# Patient Record
Sex: Male | Born: 1965 | Race: Black or African American | Hispanic: No | Marital: Married | Smoking: Current every day smoker
Health system: Southern US, Community
[De-identification: ages and names within clinical notes are randomized; demographics above are authoritative.]

## PROBLEM LIST (undated history)

## (undated) DIAGNOSIS — I10 Essential (primary) hypertension: Secondary | ICD-10-CM

## (undated) DIAGNOSIS — E119 Type 2 diabetes mellitus without complications: Secondary | ICD-10-CM

## (undated) HISTORY — DX: Type 2 diabetes mellitus without complications: E11.9

## (undated) HISTORY — PX: OTHER SURGICAL HISTORY: SHX169

## (undated) HISTORY — PX: HERNIA REPAIR: SHX51

---

## 1998-09-01 ENCOUNTER — Emergency Department (HOSPITAL_COMMUNITY): Admission: EM | Admit: 1998-09-01 | Discharge: 1998-09-01 | Payer: Self-pay | Admitting: Emergency Medicine

## 1999-12-10 ENCOUNTER — Ambulatory Visit (HOSPITAL_BASED_OUTPATIENT_CLINIC_OR_DEPARTMENT_OTHER): Admission: RE | Admit: 1999-12-10 | Discharge: 1999-12-10 | Payer: Self-pay | Admitting: Orthopedic Surgery

## 2000-08-31 ENCOUNTER — Emergency Department (HOSPITAL_COMMUNITY): Admission: EM | Admit: 2000-08-31 | Discharge: 2000-08-31 | Payer: Self-pay | Admitting: Emergency Medicine

## 2001-08-30 ENCOUNTER — Emergency Department (HOSPITAL_COMMUNITY): Admission: EM | Admit: 2001-08-30 | Discharge: 2001-08-30 | Payer: Self-pay | Admitting: Emergency Medicine

## 2001-08-30 ENCOUNTER — Encounter: Payer: Self-pay | Admitting: Emergency Medicine

## 2005-02-05 ENCOUNTER — Emergency Department (HOSPITAL_COMMUNITY): Admission: EM | Admit: 2005-02-05 | Discharge: 2005-02-06 | Payer: Self-pay | Admitting: Emergency Medicine

## 2005-02-07 ENCOUNTER — Encounter (INDEPENDENT_AMBULATORY_CARE_PROVIDER_SITE_OTHER): Payer: Self-pay | Admitting: General Surgery

## 2005-02-07 ENCOUNTER — Inpatient Hospital Stay (HOSPITAL_COMMUNITY): Admission: EM | Admit: 2005-02-07 | Discharge: 2005-02-09 | Payer: Self-pay | Admitting: Emergency Medicine

## 2006-03-10 ENCOUNTER — Ambulatory Visit: Payer: Self-pay | Admitting: Family Medicine

## 2008-03-27 ENCOUNTER — Emergency Department (HOSPITAL_COMMUNITY): Admission: EM | Admit: 2008-03-27 | Discharge: 2008-03-27 | Payer: Self-pay | Admitting: Emergency Medicine

## 2010-06-20 NOTE — Op Note (Signed)
NAME:  JMARI, PELC                ACCOUNT NO.:  [id number]   MEDICAL RECORD NO.:  [id number]          PATIENT TYPE:  INP   LOCATION:  A331                          FACILITY:  [facility]   PHYSICIAN:  Jerolyn Shin C. Katrinka Blazing, M.D.   DATE OF BIRTH:  12-08-1965   DATE OF PROCEDURE:  02/07/2005  DATE OF DISCHARGE:  02/09/2005                                 OPERATIVE REPORT   PREOPERATIVE DIAGNOSIS:  Left inguinal hernia with incarceration.   POSTOPERATIVE DIAGNOSIS:  Left inguinal hernia with inflammation of the cord  and left testicle.   PROCEDURE:  Left inguinal hernia repair with exploration of left  hemiscrotum.   SURGEON:  Dr. Katrinka Blazing.   DESCRIPTION:  Under general anesthesia, the patient's left inguinal area and  genitalia were prepped and draped in a sterile field. Curvilinear incision  was made in the left inguinal area. Incision extended down to the  aponeurosis. The aponeurosis was opened in the line of its fibers. Upon  opening the aponeurosis, there was evidence of inflammation of the cord.  This inflammation extended back to the internal ring. Exploration revealed a  sac which was separated from the cord, high ligated, excised. There was also  lipoma of the cord that was dissected. This was high ligated and excised.  The inguinal floor was attenuated, but it was felt that it would not be best  to do a formal repair at this time since there did not appear to be a major  direct component. The cord was explored down to the testicle, and the  testicle also appeared to be acutely inflamed, suggesting that he had an  orchitis. The epididymis also appeared to be inflamed, and this appeared to  be secondarily inflamed rather than a primary source. Once this was done, it  was decided that we would simply treat the patient with antibiotics and anti-  inflammatories and watch him. His testicle was placed back in the scrotal  sac, and the cord was placed in anatomic position. Aponeurosis was  closed  with 3-0 Monocryl. Subcutaneous tissue was closed with 3-0 Monocryl. Skin  was closed with 4-0 Vicryl. Dressing was placed. The patient was giving a  scrotal support. He was awakened from anesthesia uneventfully and traversed  to a bed and taken to the post-anesthesia care unit in satisfactory  condition.      Dirk Dress. Katrinka Blazing, M.D.  Electronically Signed     LCS/MEDQ  D:  03/24/2005  T:  03/25/2005  Job:  [id number]

## 2010-06-20 NOTE — Discharge Summary (Signed)
NAME:  Matthew Thompson, Matthew Thompson                ACCOUNT NO.:  [id number]   MEDICAL RECORD NO.:  [id number]          PATIENT TYPE:  INP   LOCATION:  A331                          FACILITY:  [facility]   PHYSICIAN:  Jerolyn Shin C. Katrinka Blazing, M.D.   DATE OF BIRTH:  [birth year]   DATE OF ADMISSION:  02/07/2005  DATE OF DISCHARGE:  01/08/2007LH                                 DISCHARGE SUMMARY   DISCHARGE DIAGNOSIS:  Left inguinal hernia with inflammation of the cord and  left testicle.   SPECIAL PROCEDURE:  Left inguinal hernia repair with exploration of the left  hemiscrotum.   DISPOSITION:  The patient discharged home in stable satisfactory condition.   DISCHARGE MEDICATIONS:  1.  Ibuprofen 800 mg three times daily.  2.  Levaquin 750 mg daily.  3.  Doxycycline 100 mg twice daily.  4.  Oxycodone 5/325 one every 4 hours as needed.   FOLLOW UP:  The patient is scheduled to be seen in the office 2 weeks post  discharge.   SUMMARY:  This is a 45 year old male with a history of acute onset of  swelling of the left testicle and inguinal area on January 4. He was seen in  the emergency room and was advised to be admitted for urgent surgery but he  refused. He returned on January 5 because of constant pain. He was scheduled  emergently. Past history is negative otherwise. On examination he had a  large tender mass of the left scrotum and cord. It was suspected that he had  a hydrocele and hernia. He was explored and was found to have a hernia with  inflammation of the scrotum and cord. High ligation of the sac with excision  of the lipoma was carried out. No repair of the floor was done because of  the inflammation and the lack of knowledge of the source of the  inflammation. The patient was treated with antibiotics in the postoperative  period and did very well, was monitored for 2 days and was discharged home  on antibiotics in satisfactory condition.      Dirk Dress. Katrinka Blazing, M.D.  Electronically Signed     LCS/MEDQ  D:  03/24/2005  T:  03/25/2005  Job:  [id number]

## 2010-06-20 NOTE — Op Note (Signed)
[facility]  Patient:    Matthew Thompson, Matthew Thompson                         MRN: [id number] Proc. Date: 12/10/99 Adm. Date:  [id number] Attending:  Marlowe Shores CC:         Artist Pais Mina Marble, M.D. (2)   Operative Report  PREOPERATIVE DIAGNOSIS:  Chronic instability, left thumb metacarpal phalangeal joint.  POSTOPERATIVE DIAGNOSIS:  Chronic instability, left thumb metacarpal phalangeal joint.  PROCEDURE:  Left thumb metacarpal phalangeal joint arthrodesis using tension band technique, using two 035 K-wires and #24 gauge stainless steel wire.  SURGEON:  Artist Pais. Mina Marble, M.D.  ASSISTANT:  Junius Roads. Ireton, P.A.-C.  ANESTHESIA:  General.  TOURNIQUET TIME:  45 minutes.  COMPLICATIONS:  None.  DRAINS:  None.  DESCRIPTION OF PROCEDURE:  The patient was taken to the operating room, and after the induction of adequate general anesthesia, the left upper extremity was prepped and draped in the usual sterile fashion.  An Esmarch was used to exsanguinate the limb.  The tourniquet was inflated to 250 mmHg.  At this point in time, a longitudinal incision was made over the metacarpal phalangeal joint of the left thumb.  The incision was taken down through the skin and subcutaneous tissues, until the interval between the EPL and EPB tendons was identified.  This was split longitudinally, thus exposing the capsule of the metacarpal phalangeal joint.  The capsule of the metacarpal phalangeal joint was split longitudinally, thus exposing the metacarpal phalangeal joint.  At this point in time, the cartilage of the metacarpal head and the base of the proximal phalanx was denuded using a combination of a sharp rongeur and an osteotome until cancellus bone was encountered.  The cancellus bone was then drilled with a 035 K-wire to produce bleeding.  At this point in time, two 035 K-wires were driven from distal to proximal and from volar to dorsal, out  the metacarpal head and neck area.  The joint was then reduced and placed in 15 degrees of flexion and 10 degrees of pronation.  The K-wires were driven across the fusion site.  Next the 035 K-wire was used to make a transosseous tunnel 1.0 cm distal to the fusion site, and a #24 gauge wire was passed through this hole.  This was then fashioned in a figure-of-eight tension band across the fusion site, and tied down accordingly, over the prevent 035 K-wires that had been placed previously.  Intraoperative x-rays showed a good reduction of the joint, with 15-20 degrees of flexion, 10 degrees of pronation, and good placement of all hardware.  This was then tensioned down appropriately and cut.  The K-wires were buried into the bone, and the tension band was bent down onto the bone as well.  The wound was then thoroughly irrigated.  The capsule was closed with #4-0 Mersilene, and the skin was then closed with a running #3-0 Prolene subcuticular stitch.  Steri-Strips, 4 x 4s, and fluffs, and a radial gutter splint were applied.  The patient tolerated the procedure well and went to the recovery room in a stable fashion.DD:  12/10/99 TD:  12/10/99 Job: [id number]

## 2013-11-19 ENCOUNTER — Encounter (HOSPITAL_COMMUNITY): Payer: Self-pay | Admitting: Emergency Medicine

## 2013-11-19 ENCOUNTER — Emergency Department (HOSPITAL_COMMUNITY)
Admission: EM | Admit: 2013-11-19 | Discharge: 2013-11-19 | Disposition: A | Discharge status: Home / Self Care | Payer: Self-pay | Attending: Emergency Medicine | Admitting: Emergency Medicine

## 2013-11-19 ENCOUNTER — Emergency Department (HOSPITAL_COMMUNITY): Payer: Self-pay

## 2013-11-19 DIAGNOSIS — M25511 Pain in right shoulder: Secondary | ICD-10-CM | POA: Insufficient documentation

## 2013-11-19 DIAGNOSIS — Z7952 Long term (current) use of systemic steroids: Secondary | ICD-10-CM | POA: Insufficient documentation

## 2013-11-19 DIAGNOSIS — Z720 Tobacco use: Secondary | ICD-10-CM | POA: Insufficient documentation

## 2013-11-19 MED ORDER — OXYCODONE-ACETAMINOPHEN 5-325 MG PO TABS
2.0000 | ORAL_TABLET | Freq: Once | ORAL | Status: AC
  Administered 2013-11-19: 2 via ORAL
  Filled 2013-11-19: qty 2

## 2013-11-19 MED ORDER — PREDNISONE 50 MG PO TABS: ORAL_TABLET | ORAL | Status: DC

## 2013-11-19 MED ORDER — CYCLOBENZAPRINE HCL 10 MG PO TABS: 10.0000 mg | ORAL_TABLET | Freq: Two times a day (BID) | ORAL | Status: DC | PRN

## 2013-11-19 MED ORDER — PREDNISONE 50 MG PO TABS
60.0000 mg | ORAL_TABLET | Freq: Once | ORAL | Status: AC
  Administered 2013-11-19: 60 mg via ORAL
  Filled 2013-11-19 (×2): qty 1

## 2013-11-19 MED ORDER — OXYCODONE-ACETAMINOPHEN 5-325 MG PO TABS: 1.0000 | ORAL_TABLET | ORAL | Status: DC | PRN

## 2013-11-19 NOTE — ED Notes (Signed)
PT reports right shoulder/arm pain x2 weeks. PT denies any injury but states he does some heavy lifting on his job.

## 2013-11-19 NOTE — Discharge Instructions (Signed)
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   Followup with orthopedic doctor. Phone number given.  Prescription for pain medication, muscle relaxer, prednisone

## 2013-11-19 NOTE — ED Notes (Signed)
Patient with no complaints at this time. Respirations even and unlabored. Skin warm/dry. Discharge instructions reviewed with patient at this time. Patient given opportunity to voice concerns/ask questions. Patient discharged at this time and left Emergency Department with steady gait.   

## 2013-11-19 NOTE — ED Provider Notes (Signed)
CSN: [id number]     Arrival date & time 11/19/13  1705 History   First MD Initiated Contact with Patient 11/19/13 1801     Chief Complaint  Patient presents with  . Shoulder Pain     (Consider location/radiation/quality/duration/timing/severity/associated sxs/prior Treatment) HPI.... right shoulder pain for 2 weeks. 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 No other known injury. No substernal chest pain, dyspnea, neurological deficits, fever, chills.  Severity is moderate. He has tried over-the-counter products with minimal relief.  History reviewed. No pertinent past medical history. Past Surgical History  Procedure Laterality Date  . Left hand     No family history on file. History  Substance Use Topics  . Smoking status: Current Every Day Smoker -- 0.50 packs/day    Types: Cigarettes  . Smokeless tobacco: Not on file  . Alcohol Use: Yes     Comment: occassionally    Review of Systems  All other systems reviewed and are negative.     Allergies  Review of patient's allergies indicates no known allergies.  Home Medications   Prior to Admission medications   Medication Sig Start Date End Date Taking? Authorizing Provider  ibuprofen (ADVIL,MOTRIN) 200 MG tablet Take 800-1,000 mg by mouth every 6 (six) hours as needed.   Yes Historical Provider, MD  cyclobenzaprine (FLEXERIL) 10 MG tablet Take 1 tablet (10 mg total) by mouth 2 (two) times daily as needed for muscle spasms. 11/19/13   Donnetta HutchingBrian Zana Biancardi, MD  oxyCODONE-acetaminophen (PERCOCET) 5-325 MG per tablet Take 1-2 tablets by mouth every 4 (four) hours as needed. 11/19/13   Donnetta HutchingBrian Katrenia Alkins, MD  predniSONE (DELTASONE) 50 MG tablet 1 tablet daily for 6 days, then one half tablet daily for 6 days 11/19/13   Donnetta HutchingBrian Tameca Jerez, MD   BP 148/91  Pulse 82  Temp(Src) 98.6 F (37 C) (Oral)  Resp 20  Ht 6\' 4"  (1.93 m)  Wt 247 lb (112.038 kg)  BMI 30.08 kg/m2  SpO2 100% Physical  Exam  Nursing note and vitals reviewed. Constitutional: He is oriented to person, place, and time. He appears well-developed and well-nourished.  HENT:  Head: Normocephalic and atraumatic.  Eyes: Conjunctivae and EOM are normal. Pupils are equal, round, and reactive to light.  Neck: Normal range of motion. Neck supple.  Cervical spine is nontender  Cardiovascular: Normal rate, regular rhythm and normal heart sounds.   Pulmonary/Chest: Effort normal and breath sounds normal.  Abdominal: Soft. Bowel sounds are normal.  Musculoskeletal:  Right upper extremity: Generalized tenderness around the rotator cuff.  Pain noted with range of motion around the shoulder joint.  Neurological: He is alert and oriented to person, place, and time.  Skin: Skin is warm and dry.  Psychiatric: He has a normal mood and affect. His behavior is normal.    ED Course  Procedures (including critical care time) Labs Review Labs Reviewed - No data to display  Imaging Review Dg Shoulder Right  11/19/2013   CLINICAL DATA:  48 year old male with right shoulder pain for 2 weeks. No known injury.  EXAM: RIGHT SHOULDER - 2+ VIEW  COMPARISON:  None.  FINDINGS: No acute bony abnormality. No significant soft tissue swelling. Glenohumeral joint is congruent. Degenerative changes of the acromioclavicular joint. Unremarkable appearance of the proximal humerus.  Unremarkable appearance of the visualized right thorax.  IMPRESSION: No acute abnormality.  Degenerative changes of the acromioclavicular joint.  Signed,  Yvone NeuJaime S. Loreta AveWagner, DO  [facility]  [facility]   Electronically Signed   By: Gilmer MorJaime  Wagner D.O.   On: 11/19/2013 19:06     EKG Interpretation None      MDM   Final diagnoses:  Right shoulder pain    I suspect a repetitive motion injury. Plain films of the right shoulder show no acute abnormalities;  However, there is degenerative changes in the acromioclavicular  joint. Discharge medications Percocet, prednisone 50 mg, Flexeril 10 mg.  Referral to orthopedics    Donnetta HutchingBrian Mercedez Boule, MD 11/19/13 2048

## 2013-11-29 ENCOUNTER — Encounter (HOSPITAL_COMMUNITY): Payer: Self-pay | Admitting: Emergency Medicine

## 2013-11-29 ENCOUNTER — Emergency Department (HOSPITAL_COMMUNITY): Payer: Self-pay

## 2013-11-29 ENCOUNTER — Emergency Department (HOSPITAL_COMMUNITY)
Admission: EM | Admit: 2013-11-29 | Discharge: 2013-11-29 | Disposition: A | Discharge status: Home / Self Care | Payer: Self-pay | Attending: Emergency Medicine | Admitting: Emergency Medicine

## 2013-11-29 DIAGNOSIS — Y9389 Activity, other specified: Secondary | ICD-10-CM | POA: Insufficient documentation

## 2013-11-29 DIAGNOSIS — X58XXXA Exposure to other specified factors, initial encounter: Secondary | ICD-10-CM | POA: Insufficient documentation

## 2013-11-29 DIAGNOSIS — Y9289 Other specified places as the place of occurrence of the external cause: Secondary | ICD-10-CM | POA: Insufficient documentation

## 2013-11-29 DIAGNOSIS — S46001A Unspecified injury of muscle(s) and tendon(s) of the rotator cuff of right shoulder, initial encounter: Secondary | ICD-10-CM | POA: Insufficient documentation

## 2013-11-29 DIAGNOSIS — S46111A Strain of muscle, fascia and tendon of long head of biceps, right arm, initial encounter: Secondary | ICD-10-CM | POA: Insufficient documentation

## 2013-11-29 DIAGNOSIS — M25511 Pain in right shoulder: Secondary | ICD-10-CM

## 2013-11-29 DIAGNOSIS — Z720 Tobacco use: Secondary | ICD-10-CM | POA: Insufficient documentation

## 2013-11-29 DIAGNOSIS — R52 Pain, unspecified: Secondary | ICD-10-CM

## 2013-11-29 MED ORDER — CYCLOBENZAPRINE HCL 10 MG PO TABS: 10.0000 mg | ORAL_TABLET | Freq: Two times a day (BID) | ORAL | Status: DC | PRN

## 2013-11-29 MED ORDER — NAPROXEN 500 MG PO TABS: 500.0000 mg | ORAL_TABLET | Freq: Two times a day (BID) | ORAL | Status: DC

## 2013-11-29 MED ORDER — OXYCODONE-ACETAMINOPHEN 5-325 MG PO TABS: 1.0000 | ORAL_TABLET | ORAL | Status: DC | PRN

## 2013-11-29 NOTE — ED Provider Notes (Signed)
CSN: [id number]     Arrival date & time 11/29/13  0827 History   First MD Initiated Contact with Patient 11/29/13 [phone]     Chief Complaint  Patient presents with  . Shoulder Pain     (Consider location/radiation/quality/duration/timing/severity/associated sxs/prior Treatment) Patient is a 48 y.o. male presenting with shoulder pain. The history is provided by the patient.  Shoulder Pain The current episode started more than 1 month ago. The problem occurs constantly. The problem has been gradually worsening. He has tried NSAIDs and oral narcotics (muscle relaxant) for the symptoms. The treatment provided mild relief.   Matthew Thompson is a 48 y.o. male who presents to the ED with right shoulder pain. The pain started 3 months ago and has gotten progressively worse. He was evaluated here 2 or more weeks ago and had x-rays and treated with muscle relaxant, narcotics and prednisone. The patient states the pain is worse than on the previous visit. 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 He may lift 200 pounds when he works out.    History reviewed. No pertinent past medical history. Past Surgical History  Procedure Laterality Date  . Left hand     No family history on file. History  Substance Use Topics  . Smoking status: Current Every Day Smoker -- 0.50 packs/day    Types: Cigarettes  . Smokeless tobacco: Not on file  . Alcohol Use: Yes     Comment: occassionally    Review of Systems Negative except as stated in HPI   Allergies  Review of patient's allergies indicates no known allergies.  Home Medications   Prior to Admission medications   Medication Sig Start Date End Date Taking? Authorizing Provider  ibuprofen (ADVIL,MOTRIN) 200 MG tablet Take 800-1,000 mg by mouth every 6 (six) hours as needed.   Yes Historical Provider, MD   BP 164/101  Pulse 81  Temp(Src) 97.6 F (36.4 C) (Oral)  Resp 19  Ht 6\' 1"  (1.854 m)  Wt 247  lb (112.038 kg)  BMI 32.59 kg/m2  SpO2 99% Physical Exam  Nursing note and vitals reviewed. Constitutional: He is oriented to person, place, and time. He appears well-developed and well-nourished.  HENT:  Head: Normocephalic and atraumatic.  Eyes: Conjunctivae and EOM are normal.  Neck: Normal range of motion. Neck supple.  Cardiovascular: Normal rate and regular rhythm.   Pulmonary/Chest: Effort normal and breath sounds normal.  Musculoskeletal:       Right shoulder: He exhibits decreased range of motion, tenderness, pain and spasm. He exhibits no crepitus, no deformity, no laceration, normal pulse and normal strength.       Arms: Radial pulses equal. Adequate circulation, good touch sensation. Pain with any range of motion of the shoulder. Increased pain with raising the arm.   Neurological: He is alert and oriented to person, place, and time. No cranial nerve deficit.  Skin: Skin is warm and dry.  Psychiatric: He has a normal mood and affect. His behavior is normal.    ED Course  Procedures (including critical care time) Labs Review Mr Shoulder Right Wo Contrast  11/29/2013   CLINICAL DATA:  Right shoulder pain for over 3 months. No known injury or prior relevant surgery. Initial encounter.  EXAM: MRI OF THE RIGHT SHOULDER WITHOUT CONTRAST  TECHNIQUE: Multiplanar, multisequence MR imaging of the shoulder was performed. No intravenous contrast was administered.  COMPARISON:  Radiographs 11/19/2013.  FINDINGS: Rotator cuff:  There is distal supraspinatus tendinosis with cystic change in the adjacent greater tuberosity. There is mild associated bursal surface fraying, but no full-thickness rotator cuff tear. The infraspinous, subscapularis and teres minor tendons appear normal.  Muscles: The rotator cuff muscles are well developed. There is no focal muscular atrophy or edema.  Biceps long head: The intra-articular portion of the biceps tendon appears normal. Distally, the biceps tendon  appears split with associated loculated fluid in its sheath.  Acromioclavicular Joint: The acromion is type 2. There are moderate acromioclavicular degenerative changes. There is subchondral cyst formation and edema within the distal clavicle. No osteolysis is apparent. A small amount of fluid is present in the subacromial and subdeltoid bursa.  Glenohumeral Joint: No significant shoulder joint effusion or glenohumeral arthropathy.  Labrum:  No evidence of labral tear.  Bones:  No significant extra-articular osseous findings.  IMPRESSION: 1. Distal supraspinatus tendinosis and partial bursal surface fraying. No evidence of full-thickness rotator cuff tear or tendon retraction. 2. Moderate acromioclavicular degenerative changes with mild subacromial -subdeltoid bursitis. 3. Suspected split tear of the biceps tendon long head with associated tenosynovitis. A normal variant accounting for this appearance is felt to be less likely.   Electronically Signed   By: Roxy HorsemanBill  Veazey M.D.   On: 11/29/2013 11:33    MDM  48 y.o. male with pain in the right shoulder x 3 months that has gotten worse. I have reviewed this patient's vital signs, nurses notes, appropriate labs and imaging.  I discussed findings of the MR with the patient and need for follow up with the orthopedic doctor as soon as possible. Patient voices understanding and agrees with plan. Also discussed elevated BP and need for follow up. Stable for discharge without neurovascular deficits. Will treat for pain, placed in shoulder immobilizer, ice and rest. Follow up with ortho.    Medication List    TAKE these medications       cyclobenzaprine 10 MG tablet  Commonly known as:  FLEXERIL  Take 1 tablet (10 mg total) by mouth 2 (two) times daily as needed for muscle spasms.     naproxen 500 MG tablet  Commonly known as:  NAPROSYN  Take 1 tablet (500 mg total) by mouth 2 (two) times daily.     oxyCODONE-acetaminophen 5-325 MG per tablet  Commonly known  as:  ROXICET  Take 1 tablet by mouth every 4 (four) hours as needed for severe pain.      ASK your doctor about these medications       ibuprofen 200 MG tablet  Commonly known as:  ADVIL,MOTRIN  Take 800-1,000 mg by mouth every 6 (six) hours as needed.            [facility] [facility] Orlene OchM Erland Vivas, NP 12/02/13 1054

## 2013-11-29 NOTE — ED Notes (Addendum)
Pt states shoulder pain, stating he was here 3 weeks ago for same. Denies injury. States medications prescribed last visit did not provide relief. Pt states he was told to come back here if pain became worse and states he was not referred to orthopedist.

## 2013-11-29 NOTE — Discharge Instructions (Signed)
Your MRI today shows that you have injured your right shoulder and will need to follow up with an orthopedic surgeon. Do not take the narcotic pain medication if you are driving as it will make you sleepy. Do not take the muscle relaxant if you are driving as it will also make you sleepy.  Mr Shoulder Right Wo Contrast  11/29/2013   CLINICAL DATA:  Right shoulder pain for over 3 months. No known injury or prior relevant surgery. Initial encounter.  EXAM: MRI OF THE RIGHT SHOULDER WITHOUT CONTRAST  TECHNIQUE: Multiplanar, multisequence MR imaging of the shoulder was performed. No intravenous contrast was administered.  COMPARISON:  Radiographs 11/19/2013.  FINDINGS: Rotator cuff: There is distal supraspinatus tendinosis with cystic change in the adjacent greater tuberosity. There is mild associated bursal surface fraying, but no full-thickness rotator cuff tear. The infraspinous, subscapularis and teres minor tendons appear normal.  Muscles: The rotator cuff muscles are well developed. There is no focal muscular atrophy or edema.  Biceps long head: The intra-articular portion of the biceps tendon appears normal. Distally, the biceps tendon appears split with associated loculated fluid in its sheath.  Acromioclavicular Joint: The acromion is type 2. There are moderate acromioclavicular degenerative changes. There is subchondral cyst formation and edema within the distal clavicle. No osteolysis is apparent. A small amount of fluid is present in the subacromial and subdeltoid bursa.  Glenohumeral Joint: No significant shoulder joint effusion or glenohumeral arthropathy.  Labrum:  No evidence of labral tear.  Bones:  No significant extra-articular osseous findings.  IMPRESSION: 1. Distal supraspinatus tendinosis and partial bursal surface fraying. No evidence of full-thickness rotator cuff tear or tendon retraction. 2. Moderate acromioclavicular degenerative changes with mild subacromial -subdeltoid bursitis. 3.  Suspected split tear of the biceps tendon long head with associated tenosynovitis. A normal variant accounting for this appearance is felt to be less likely.   Electronically Signed   By: Roxy HorsemanBill  Veazey M.D.   On: 11/29/2013 11:33

## 2013-12-12 ENCOUNTER — Ambulatory Visit: Payer: Self-pay | Admitting: Orthopedic Surgery

## 2014-12-15 ENCOUNTER — Encounter (HOSPITAL_COMMUNITY): Payer: Self-pay | Admitting: *Deleted

## 2014-12-15 ENCOUNTER — Emergency Department (HOSPITAL_COMMUNITY)
Admission: EM | Admit: 2014-12-15 | Discharge: 2014-12-16 | Disposition: A | Discharge status: Home / Self Care | Payer: Self-pay | Attending: Emergency Medicine | Admitting: Emergency Medicine

## 2014-12-15 ENCOUNTER — Emergency Department (HOSPITAL_COMMUNITY): Payer: Self-pay

## 2014-12-15 DIAGNOSIS — Y998 Other external cause status: Secondary | ICD-10-CM | POA: Insufficient documentation

## 2014-12-15 DIAGNOSIS — Y9289 Other specified places as the place of occurrence of the external cause: Secondary | ICD-10-CM | POA: Insufficient documentation

## 2014-12-15 DIAGNOSIS — S51012A Laceration without foreign body of left elbow, initial encounter: Secondary | ICD-10-CM | POA: Insufficient documentation

## 2014-12-15 DIAGNOSIS — F17210 Nicotine dependence, cigarettes, uncomplicated: Secondary | ICD-10-CM | POA: Insufficient documentation

## 2014-12-15 DIAGNOSIS — Y9389 Activity, other specified: Secondary | ICD-10-CM | POA: Insufficient documentation

## 2014-12-15 DIAGNOSIS — W25XXXA Contact with sharp glass, initial encounter: Secondary | ICD-10-CM | POA: Insufficient documentation

## 2014-12-15 DIAGNOSIS — I10 Essential (primary) hypertension: Secondary | ICD-10-CM | POA: Insufficient documentation

## 2014-12-15 HISTORY — DX: Essential (primary) hypertension: I10

## 2014-12-15 MED ORDER — LIDOCAINE-EPINEPHRINE (PF) 2 %-1:200000 IJ SOLN
20.0000 mL | Freq: Once | INTRAMUSCULAR | Status: AC
  Administered 2014-12-15: 20 mL
  Filled 2014-12-15: qty 20

## 2014-12-15 MED ORDER — HYDROCODONE-ACETAMINOPHEN 5-325 MG PO TABS
2.0000 | ORAL_TABLET | Freq: Once | ORAL | Status: AC
  Administered 2014-12-15: 2 via ORAL
  Filled 2014-12-15: qty 2

## 2014-12-15 NOTE — ED Notes (Addendum)
Pt leaned on a glass counter and his left elbow went thru it. Pt states his elbow has been shooting blood since 4pm. Elbow wrapped tight in triage by family. Small amount of blood oozing through dressing.

## 2014-12-15 NOTE — ED Provider Notes (Signed)
CSN: [id number]     Arrival date & time 12/15/14  2024 History  By signing my name below, I, Doreatha Martinva Mathews, attest that this documentation has been prepared under the direction and in the presence of Lavera Guiseana Duo Malie Kashani, MD. Electronically Signed: Doreatha MartinEva Mathews, ED Scribe. 12/15/2014. 9:53 PM.   Chief Complaint  Patient presents with  . Extremity Laceration   The history is provided by the patient. No language interpreter was used.   HPI Comments: Matthew Thompson is a 49 y.o. male with h/o HTN who is right hand dominant who presents to the Emergency Department complaining of a laceration with controlled bleeding to the lateral aspect of the left elbow that occurred this afternoon at 4PM. Wife states that he leaned on a glass counter and his elbow broke and went through the glass. Bleeding controlled with pressure dressing applied PTA. He states associated moderate shooting pain in the forearm. Tdap UTD. No h/o prior fractures. Pt denies anticoagulant use. He also denies numbness.     Past Medical History  Diagnosis Date  . Hypertension    Past Surgical History  Procedure Laterality Date  . Left hand     History reviewed. No pertinent family history. Social History  Substance Use Topics  . Smoking status: Current Every Day Smoker -- 0.50 packs/day    Types: Cigarettes  . Smokeless tobacco: None  . Alcohol Use: Yes     Comment: occassionally    Review of Systems 10/14 systems reviewed and are negative other than those stated in the HPI.    Allergies  Review of patient's allergies indicates no known allergies.  Home Medications   Prior to Admission medications   Not on File   BP 124/75 mmHg  Pulse 99  Temp(Src) 98 F (36.7 C) (Oral)  Resp 13  Ht 6\' 4"  (1.93 m)  Wt 252 lb (114.306 kg)  BMI 30.69 kg/m2  SpO2 93% Physical Exam Physical Exam  Nursing note and vitals reviewed. Constitutional: Well developed, well nourished, non-toxic, and in no acute distress Head: Normocephalic  and atraumatic.  Mouth/Throat: Oropharynx is clear and moist.  Neck: Normal range of motion. Neck supple.  Cardiovascular: Normal rate and regular rhythm.   Pulmonary/Chest: Effort normal and breath sounds normal.  Abdominal: Soft. There is no tenderness. There is no rebound and no guarding.  Musculoskeletal: Normal range of motion.  Neurological: Alert, no facial droop, fluent speech, moves all extremities symmetrically Skin: Skin is warm and dry. There is a 4 cm laceration just inferior to the left elbow. He has intact innervation of the radial, ulnar and median nerves. DTRs intact. 2+ radial pulse on the left. Normal ROM of the elbow, wrist and fingers distally. Initial bleeding from superficial artery, bleeding ceased with pressure. Psychiatric: Cooperative   ED Course  Procedures (including critical care time) DIAGNOSTIC STUDIES: Oxygen Saturation is 100% on RA, normal by my interpretation.    COORDINATION OF CARE: 9:42 PM Discussed treatment plan with pt at bedside and pt agreed to plan.   Imaging Review Dg Elbow Complete Left  12/15/2014  CLINICAL DATA:  Laceration left elbow ulna glass table today. Initial encounter. EXAM: LEFT ELBOW - COMPLETE 3+ VIEW COMPARISON:  None. FINDINGS: No acute bony or joint abnormality is identified. There is no elbow joint effusion. No radiopaque foreign body is seen. Small spur the triceps tendon insertion is noted. IMPRESSION: Negative exam. Electronically Signed   By: Drusilla Kannerhomas  Dalessio M.D.   On: 12/15/2014 22:06   I  have personally reviewed and evaluated these images as part of my medical decision-making.  LACERATION REPAIR PROCEDURE NOTE The patient's identification was confirmed and consent was obtained. This procedure was performed by Lavera Guise, MD at 12:00 AM. Site: inferior to the left elbow Sterile procedures observed Anesthetic used (type and amt): Lidocaine 2% with epi Suture type/size: 4-0 Length: 4cm # of Sutures: 5 Technique:  Simple interrupted Complexity: simple Copious saline irrigation prior Antibx ointment applied Tetanus UTD Site anesthetized, irrigated with NS, explored without evidence of foreign body, wound well approximated, site covered with dry, sterile dressing.  Patient tolerated procedure well without complications. Instructions for care discussed verbally and patient provided with additional written instructions for homecare and f/u.   MDM   Final diagnoses:  Elbow laceration, left, initial encounter     49 year old male who presents with  Laceration below the left elbow. X-ray without foreign bodies. Extremity is neurovascularly intact distally. Compartments are soft. Laceration repaired according to procedure note above. Tetanus up to date. Discussed laceration and wound care. Patient to follow-up with PCP in 1 week for suture removal. Infection warning signs reviewed.  I personally performed the services described in this documentation, which was scribed in my presence. The recorded information has been reviewed and is accurate.   Lavera Guise, MD 12/16/14 0001

## 2014-12-15 NOTE — Discharge Instructions (Signed)
Please see your primary care provider in 7-10 days for suture removal. Return without fail for worsening symptoms, including fevers, pus drainage, increased redness/swelling/pain, or any other symptoms concerning to you.   Laceration Care, Adult A laceration is a cut that goes through all of the layers of the skin and into the tissue that is right under the skin. Some lacerations heal on their own. Others need to be closed with stitches (sutures), staples, skin adhesive strips, or skin glue. Proper laceration care minimizes the risk of infection and helps the laceration to heal better. HOW TO CARE FOR YOUR LACERATION If sutures or staples were used:  Keep the wound clean and dry.  If you were given a bandage (dressing), you should change it at least one time per day or as told by your health care provider. You should also change it if it becomes wet or dirty.  Keep the wound completely dry for the first 24 hours or as told by your health care provider. After that time, you may shower or bathe. However, make sure that the wound is not soaked in water until after the sutures or staples have been removed.  Clean the wound one time each day or as told by your health care provider:  Wash the wound with soap and water.  Rinse the wound with water to remove all soap.  Pat the wound dry with a clean towel. Do not rub the wound.  After cleaning the wound, apply a thin layer of antibiotic ointmentas told by your health care provider. This will help to prevent infection and keep the dressing from sticking to the wound.  Have the sutures or staples removed as told by your health care provider. If skin adhesive strips were used:  Keep the wound clean and dry.  If you were given a bandage (dressing), you should change it at least one time per day or as told by your health care provider. You should also change it if it becomes dirty or wet.  Do not get the skin adhesive strips wet. You may shower or  bathe, but be careful to keep the wound dry.  If the wound gets wet, pat it dry with a clean towel. Do not rub the wound.  Skin adhesive strips fall off on their own. You may trim the strips as the wound heals. Do not remove skin adhesive strips that are still stuck to the wound. They will fall off in time. If skin glue was used:  Try to keep the wound dry, but you may briefly wet it in the shower or bath. Do not soak the wound in water, such as by swimming.  After you have showered or bathed, gently pat the wound dry with a clean towel. Do not rub the wound.  Do not do any activities that will make you sweat heavily until the skin glue has fallen off on its own.  Do not apply liquid, cream, or ointment medicine to the wound while the skin glue is in place. Using those may loosen the film before the wound has healed.  If you were given a bandage (dressing), you should change it at least one time per day or as told by your health care provider. You should also change it if it becomes dirty or wet.  If a dressing is placed over the wound, be careful not to apply tape directly over the skin glue. Doing that may cause the glue to be pulled off before the wound  has healed.  Do not pick at the glue. The skin glue usually remains in place for 5-10 days, then it falls off of the skin. General Instructions  Take over-the-counter and prescription medicines only as told by your health care provider.  If you were prescribed an antibiotic medicine or ointment, take or apply it as told by your doctor. Do not stop using it even if your condition improves.  To help prevent scarring, make sure to cover your wound with sunscreen whenever you are outside after stitches are removed, after adhesive strips are removed, or when glue remains in place and the wound is healed. Make sure to wear a sunscreen of at least 30 SPF.  Do not scratch or pick at the wound.  Keep all follow-up visits as told by your health  care provider. This is important.  Check your wound every day for signs of infection. Watch for:  Redness, swelling, or pain.  Fluid, blood, or pus.  Raise (elevate) the injured area above the level of your heart while you are sitting or lying down, if possible. SEEK MEDICAL CARE IF:  You received a tetanus shot and you have swelling, severe pain, redness, or bleeding at the injection site.  You have a fever.  A wound that was closed breaks open.  You notice a bad smell coming from your wound or your dressing.  You notice something coming out of the wound, such as wood or glass.  Your pain is not controlled with medicine.  You have increased redness, swelling, or pain at the site of your wound.  You have fluid, blood, or pus coming from your wound.  You notice a change in the color of your skin near your wound.  You need to change the dressing frequently due to fluid, blood, or pus draining from the wound.  You develop a new rash.  You develop numbness around the wound. SEEK IMMEDIATE MEDICAL CARE IF:  You develop severe swelling around the wound.  Your pain suddenly increases and is severe.  You develop painful lumps near the wound or on skin that is anywhere on your body.  You have a red streak going away from your wound.  The wound is on your hand or foot and you cannot properly move a finger or toe.  The wound is on your hand or foot and you notice that your fingers or toes look pale or bluish.   This information is not intended to replace advice given to you by your health care provider. Make sure you discuss any questions you have with your health care provider.   Document Released: 01/19/2005 Document Revised: 06/05/2014 Document Reviewed: 01/15/2014 Elsevier Interactive Patient Education [employer]2016 [employer] Inc[employer]

## 2014-12-15 NOTE — ED Notes (Signed)
Patient resting in bed at this time, eyes closed, even rise and fall of chest. Family at bedside. No needs voiced at this time.

## 2014-12-16 NOTE — ED Notes (Signed)
Patient verbalizes understanding of discharge instructions, home care and follow up care. Patient ambulatory out of department at this time with family member 

## 2015-04-06 ENCOUNTER — Encounter (HOSPITAL_COMMUNITY): Payer: Self-pay | Admitting: *Deleted

## 2015-04-06 ENCOUNTER — Emergency Department (HOSPITAL_COMMUNITY)
Admission: EM | Admit: 2015-04-06 | Discharge: 2015-04-06 | Discharge status: Against Medical Advice | Payer: Self-pay | Attending: Emergency Medicine | Admitting: Emergency Medicine

## 2015-04-06 DIAGNOSIS — K4091 Unilateral inguinal hernia, without obstruction or gangrene, recurrent: Secondary | ICD-10-CM

## 2015-04-06 DIAGNOSIS — K4090 Unilateral inguinal hernia, without obstruction or gangrene, not specified as recurrent: Secondary | ICD-10-CM | POA: Insufficient documentation

## 2015-04-06 DIAGNOSIS — I10 Essential (primary) hypertension: Secondary | ICD-10-CM | POA: Insufficient documentation

## 2015-04-06 DIAGNOSIS — F17210 Nicotine dependence, cigarettes, uncomplicated: Secondary | ICD-10-CM | POA: Insufficient documentation

## 2015-04-06 DIAGNOSIS — N50811 Right testicular pain: Secondary | ICD-10-CM

## 2015-04-06 NOTE — ED Provider Notes (Signed)
CSN: [id number]     Arrival date & time 04/06/15  1940 History   First MD Initiated Contact with Patient 04/06/15 2220     Chief Complaint  Patient presents with  . Testicle Pain     (Consider location/radiation/quality/duration/timing/severity/associated sxs/prior Treatment) HPI Comments: Patient is a 50 year old male who presents to the emergency department with a complaint of testicle pain.  The patient states that approximately 4 weeks ago he was doing some heavy lifting and pushing. He states that he later noted some swelling of his lower abdomen and into his scrotal area with some tests to kill or tenderness. He states that this swelling seems to come and go. On yesterday he noticed a particular sharp pain for short period of time it did not return. But he came to the emergency department to be evaluated. It is of note that he had a similar episode on the left side, several years ago, and was diagnosed with a hernia. He denies any difficulty with urination. He denies any fever chills or hematuria. He's not had any previous operations or procedures on the right lower abdomen or right scrotum.  Patient is a 50 y.o. male presenting with testicular pain. The history is provided by the patient.  Testicle Pain    Past Medical History  Diagnosis Date  . Hypertension    Past Surgical History  Procedure Laterality Date  . Left hand     History reviewed. No pertinent family history. Social History  Substance Use Topics  . Smoking status: Current Every Day Smoker -- 0.50 packs/day    Types: Cigarettes  . Smokeless tobacco: None  . Alcohol Use: Yes     Comment: occassionally    Review of Systems  Genitourinary: Positive for testicular pain.  All other systems reviewed and are negative.     Allergies  Review of patient's allergies indicates no known allergies.  Home Medications   Prior to Admission medications   Not on File   BP 146/77 mmHg  Pulse 89  Temp(Src) 98.2 F  (36.8 C) (Oral)  Resp 14  Ht  (1.93 m)  Wt 114.306 kg  BMI 30.69 kg/m2  SpO2 97% Physical Exam  Constitutional: He is oriented to person, place, and time. He appears well-developed and well-nourished.  Non-toxic appearance.  HENT:  Head: Normocephalic.  Right Ear: Tympanic membrane and external ear normal.  Left Ear: Tympanic membrane and external ear normal.  Eyes: EOM and lids are normal. Pupils are equal, round, and reactive to light.  Neck: Normal range of motion. Neck supple. Carotid bruit is not present.  Cardiovascular: Normal rate, regular rhythm, normal heart sounds, intact distal pulses and normal pulses.   Pulmonary/Chest: Breath sounds normal. No respiratory distress.  Abdominal: Soft. Bowel sounds are normal. There is no tenderness. There is no guarding.  Patient home patient noted to have a hernia in the inguinal area. This extends into the scrotal sac area.  Genitourinary:  Patient's significant other is in the room during the examination. There is no tenderness or problem with the penis. There is no drainage or discharge noted. The right testicle is significantly larger than the left. There is minimal discomfort present. There is evidence of hernia from the lower abdomen and inguinal area. There is no evidence of any abscess in the perineal area or the scrotum. The area is not hot. There is very minimal tenderness present.  Musculoskeletal: Normal range of motion.  Lymphadenopathy:       Head (right  side): No submandibular adenopathy present.       Head (left side): No submandibular adenopathy present.    He has no cervical adenopathy.  Neurological: He is alert and oriented to person, place, and time. He has normal strength. No cranial nerve deficit or sensory deficit.  Skin: Skin is warm and dry.  Psychiatric: He has a normal mood and affect. His speech is normal.  Nursing note and vitals reviewed.   ED Course  Procedures (including critical care time) Labs  Review Labs Reviewed - No data to display  Imaging Review No results found. I have personally reviewed and evaluated these images and lab results as part of my medical decision-making.   EKG Interpretation None      MDM  The vital signs are within normal limits.  I discussed with the patient that this is probably hernia, however I could not exclude the possibility of partial torsion or other injury involving the testicle on the right. The patient states that it really doesn't bother him that much now, and he mainly wanted to just confirm that it was possibly a hernia. I discussed with him that while it could be a hernia that could be more than one thing going on at the same time. I expressed to him my desire to have an ultrasound with color Doppler for further evaluation. The patient states that he would like to be discharged home. He states that he will call Dr. Lovell SheehanJenkins and set up an appointment to talk with him. He states that he will come back to the emergency department if he develops any emergent time pain or other problems. I again discussed discussed with the patient the importance of completing the workup. He except the responsibility for leaving. The patient is discharged, and will return if any changes or problems.    Final diagnoses:  None    *I have reviewed nursing notes, vital signs, and all appropriate lab and imaging results for this patient.    Ivery QualeHobson Mckinzi Eriksen, PA-C 04/06/15 2248  Samuel JesterKathleen McManus, DO 04/07/15 1558

## 2015-04-06 NOTE — ED Notes (Signed)
Pt states his testicles are swollen and painful. Pt states this has been going on x 3-4 weeks. Denies injury.

## 2015-04-06 NOTE — Discharge Instructions (Signed)
Your vital signs at this time are within normal limits. The standard 4 emergency medicine is to have a complete workup concerning the swelling in intermittent discomfort of your right testicle. This includes some ultrasound. Please return to the emergency department immediately if any changes, pain that would not resolve with conservative measures, or concerns concerning your condition. Please see Dr. Lovell Sheehan as soon as possible for surgical evaluation. Inguinal Hernia, Adult Muscles help keep everything in the body in its proper place. But if a weak spot in the muscles develops, something can poke through. That is called a hernia. When this happens in the lower part of the belly (abdomen), it is called an inguinal hernia. (It takes its name from a part of the body in this region called the inguinal canal.) A weak spot in the wall of muscles lets some fat or part of the small intestine bulge through. An inguinal hernia can develop at any age. Men get them more often than women. CAUSES  In adults, an inguinal hernia develops over time.  It can be triggered by:  Suddenly straining the muscles of the lower abdomen.  Lifting heavy objects.  Straining to have a bowel movement. Difficult bowel movements (constipation) can lead to this.  Constant coughing. This may be caused by smoking or lung disease.  Being overweight.  Being pregnant.  Working at a job that requires long periods of standing or heavy lifting.  Having had an inguinal hernia before. One type can be an emergency situation. It is called a strangulated inguinal hernia. It develops if part of the small intestine slips through the weak spot and cannot get back into the abdomen. The blood supply can be cut off. If that happens, part of the intestine may die. This situation requires emergency surgery. SYMPTOMS  Often, a small inguinal hernia has no symptoms. It is found when a healthcare provider does a physical exam. Larger hernias  usually have symptoms.   In adults, symptoms may include:  A lump in the groin. This is easier to see when the person is standing. It might disappear when lying down.  In men, a lump in the scrotum.  Pain or burning in the groin. This occurs especially when lifting, straining or coughing.  A dull ache or feeling of pressure in the groin.  Signs of a strangulated hernia can include:  A bulge in the groin that becomes very painful and tender to the touch.  A bulge that turns red or purple.  Fever, nausea and vomiting.  Inability to have a bowel movement or to pass gas. DIAGNOSIS  To decide if you have an inguinal hernia, a healthcare provider will probably do a physical examination.  This will include asking questions about any symptoms you have noticed.  The healthcare provider might feel the groin area and ask you to cough. If an inguinal hernia is felt, the healthcare provider may try to slide it back into the abdomen.  Usually no other tests are needed. TREATMENT  Treatments can vary. The size of the hernia makes a difference. Options include:  Watchful waiting. This is often suggested if the hernia is small and you have had no symptoms.  No medical procedure will be done unless symptoms develop.  You will need to watch closely for symptoms. If any occur, contact your healthcare provider right away.  Surgery. This is used if the hernia is larger or you have symptoms.  Open surgery. This is usually an outpatient procedure (you will  not stay overnight in a hospital). An cut (incision) is made through the skin in the groin. The hernia is put back inside the abdomen. The weak area in the muscles is then repaired by herniorrhaphy or hernioplasty. Herniorrhaphy: in this type of surgery, the weak muscles are sewn back together. Hernioplasty: a patch or mesh is used to close the weak area in the abdominal wall.  Laparoscopy. In this procedure, a surgeon makes small incisions. A  thin tube with a tiny video camera (called a laparoscope) is put into the abdomen. The surgeon repairs the hernia with mesh by looking with the video camera and using two long instruments. HOME CARE INSTRUCTIONS   After surgery to repair an inguinal hernia:  You will need to take pain medicine prescribed by your healthcare provider. Follow all directions carefully.  You will need to take care of the wound from the incision.  Your activity will be restricted for awhile. This will probably include no heavy lifting for several weeks. You also should not do anything too active for a few weeks. When you can return to work will depend on the type of job that you have.  During "watchful waiting" periods, you should:  Maintain a healthy weight.  Eat a diet high in fiber (fruits, vegetables and whole grains).  Drink plenty of fluids to avoid constipation. This means drinking enough water and other liquids to keep your urine clear or pale yellow.  Do not lift heavy objects.  Do not stand for long periods of time.  Quit smoking. This should keep you from developing a frequent cough. SEEK MEDICAL CARE IF:   A bulge develops in your groin area.  You feel pain, a burning sensation or pressure in the groin. This might be worse if you are lifting or straining.  You develop a fever of more than 100.5 F (38.1 C). SEEK IMMEDIATE MEDICAL CARE IF:   Pain in the groin increases suddenly.  A bulge in the groin gets bigger suddenly and does not go down.  For men, there is sudden pain in the scrotum. Or, the size of the scrotum increases.  A bulge in the groin area becomes red or purple and is painful to touch.  You have nausea or vomiting that does not go away.  You feel your heart beating much faster than normal.  You cannot have a bowel movement or pass gas.  You develop a fever of more than 102.0 F (38.9 C).   This information is not intended to replace advice given to you by your  health care provider. Make sure you discuss any questions you have with your health care provider.   Document Released: 06/07/2008 Document Revised: 04/13/2011 Document Reviewed: 07/23/2014 Elsevier Interactive Patient Education [employer]2016 [employer]

## 2015-04-17 ENCOUNTER — Emergency Department (HOSPITAL_COMMUNITY): Payer: Self-pay

## 2015-04-17 ENCOUNTER — Encounter (HOSPITAL_COMMUNITY): Payer: Self-pay

## 2015-04-17 ENCOUNTER — Emergency Department (HOSPITAL_COMMUNITY)
Admission: EM | Admit: 2015-04-17 | Discharge: 2015-04-17 | Disposition: A | Discharge status: Home / Self Care | Payer: Self-pay | Attending: Emergency Medicine | Admitting: Emergency Medicine

## 2015-04-17 DIAGNOSIS — F17210 Nicotine dependence, cigarettes, uncomplicated: Secondary | ICD-10-CM | POA: Insufficient documentation

## 2015-04-17 DIAGNOSIS — R52 Pain, unspecified: Secondary | ICD-10-CM

## 2015-04-17 DIAGNOSIS — N5082 Scrotal pain: Secondary | ICD-10-CM | POA: Insufficient documentation

## 2015-04-17 DIAGNOSIS — I10 Essential (primary) hypertension: Secondary | ICD-10-CM | POA: Insufficient documentation

## 2015-04-17 MED ORDER — CIPROFLOXACIN HCL 500 MG PO TABS: 500.0000 mg | ORAL_TABLET | Freq: Two times a day (BID) | ORAL | Status: DC

## 2015-04-17 MED ORDER — IBUPROFEN 800 MG PO TABS: 800.0000 mg | ORAL_TABLET | Freq: Three times a day (TID) | ORAL | Status: DC | PRN

## 2015-04-17 NOTE — ED Notes (Signed)
MD at bedside. 

## 2015-04-17 NOTE — ED Provider Notes (Signed)
CSN: [id number]     Arrival date & time 04/17/15  4098 History  By signing my name below, I, Matthew Thompson, attest that this documentation has been prepared under the direction and in the presence of physician practitioner, Bethann Berkshire, MD,. Electronically Signed: [name], Scribe. 04/17/2015. 11:48 AM.     Chief Complaint  Patient presents with  . Hernia    HPI Comments: Pt complains of constant right testicular pain onset 6 weeks  Patient is a 50 y.o. male presenting with testicular pain. The history is provided by the patient (Patient complains of pain in his scrotum). No language interpreter was used.  Testicle Pain This is a new problem. The current episode started more than 1 week ago. The problem occurs constantly. The problem has not changed since onset.Pertinent negatives include no chest pain, no abdominal pain, no headaches and no shortness of breath. Nothing aggravates the symptoms. Nothing relieves the symptoms. He has tried nothing for the symptoms.    HPI Comments: Matthew Thompson is a 50 y.o. male with PMHx of HTN who presents to the Emergency Department complaining of sudden onset, constant, right testicular pain for the last 6 weeks. Pt came to the ER 11 days ago for right testicular pain and was told he has a hernia. He was instructed to follow up with Dr. Lovell Sheehan. He notes that Dr. Lovell Sheehan advised him to come to the ER and get an ultrasound a couple of days ago due to his persistent right testicular pain. Pt has not had an ultrasound taken yet. He denies numbness or any other associated symptoms.  Past Medical History  Diagnosis Date  . Hypertension    Past Surgical History  Procedure Laterality Date  . Left hand    . Hernia repair     No family history on file. Social History  Substance Use Topics  . Smoking status: Current Every Day Smoker -- 0.50 packs/day    Types: Cigarettes  . Smokeless tobacco: None  . Alcohol Use: Yes     Comment: occassionally     Review of Systems  Constitutional: Negative for appetite change and fatigue.  HENT: Negative for congestion, ear discharge and sinus pressure.   Eyes: Negative for discharge.  Respiratory: Negative for cough and shortness of breath.   Cardiovascular: Negative for chest pain.  Gastrointestinal: Negative for abdominal pain and diarrhea.  Genitourinary: Positive for testicular pain (right). Negative for frequency and hematuria.  Musculoskeletal: Negative for back pain.  Skin: Negative for rash.  Neurological: Negative for seizures, numbness and headaches.  Psychiatric/Behavioral: Negative for hallucinations.      Allergies  Review of patient's allergies indicates no known allergies.  Home Medications   Prior to Admission medications   Not on File   BP 149/77 mmHg  Pulse 91  Temp(Src) 99.2 F (37.3 C) (Oral)  Resp 20  Ht  (1.93 m)  Wt 247 lb (112.038 kg)  BMI 30.08 kg/m2  SpO2 99% Physical Exam  Constitutional: He is oriented to person, place, and time. He appears well-developed.  HENT:  Head: Normocephalic.  Eyes: Conjunctivae and EOM are normal. No scleral icterus.  Neck: Neck supple. No tracheal deviation present. No thyromegaly present.  Cardiovascular: Normal rate and regular rhythm.  Exam reveals no gallop and no friction rub.   No murmur heard. Pulmonary/Chest: No stridor. He has no wheezes. He has no rales. He exhibits no tenderness.  Abdominal: He exhibits no distension. There is no tenderness. There is no rebound.  Genitourinary:  Tender right testicle Mild tenderness right inguinal area  Musculoskeletal: Normal range of motion. He exhibits no edema.  Lymphadenopathy:    He has no cervical adenopathy.  Neurological: He is oriented to person, place, and time. He exhibits normal muscle tone. Coordination normal.  Skin: Skin is warm. No rash noted. No erythema.  Psychiatric: He has a normal mood and affect. His behavior is normal.    ED Course   Procedures (including critical care time)  DIAGNOSTIC STUDIES: Oxygen Saturation is 99% on RA, normal by my interpretation.    COORDINATION OF CARE: 11:50 AM Will order ultrasound scrotum. Discussed treatment plan with pt at bedside and pt agreed to plan.   Labs Review Labs Reviewed - No data to display  Imaging Review No results found. I have personally reviewed and evaluated these images and lab results as part of my medical decision-making.   EKG Interpretation None      MDM   Final diagnoses:  None    ultrasound does not show any hernia. No abnormalities two testicles. Will empirically treat patient for infection with Cipro twice a day along with Motrin for pain and refer him to urology    The chart was scribed for me under my direct supervision.  I personally performed the history, physical, and medical decision making and all procedures in the evaluation of this patient.Bethann Berkshire.      Colene Mines, MD 04/17/15 [phone]

## 2015-04-17 NOTE — Discharge Instructions (Signed)
Follow up with [facility] in 1-2 weeks °

## 2015-04-17 NOTE — ED Notes (Signed)
Pt reports was here on 3/4 for r testicular pain.  Reports was told he has a hernia and was instructed to f/u with Dr. Lovell SheehanJenkins.  Pt says called Dr. Lovell SheehanJenkins Monday when pain started back and he said for him to come back to ER for ultrasound evaluation.

## 2015-12-09 ENCOUNTER — Emergency Department (HOSPITAL_COMMUNITY): Payer: No Typology Code available for payment source

## 2015-12-09 ENCOUNTER — Encounter (HOSPITAL_COMMUNITY): Payer: Self-pay | Admitting: *Deleted

## 2015-12-09 ENCOUNTER — Emergency Department (HOSPITAL_COMMUNITY)
Admission: EM | Admit: 2015-12-09 | Discharge: 2015-12-10 | Disposition: A | Discharge status: Home / Self Care | Payer: No Typology Code available for payment source | Attending: Emergency Medicine | Admitting: Emergency Medicine

## 2015-12-09 DIAGNOSIS — R69 Illness, unspecified: Secondary | ICD-10-CM

## 2015-12-09 DIAGNOSIS — I10 Essential (primary) hypertension: Secondary | ICD-10-CM | POA: Diagnosis not present

## 2015-12-09 DIAGNOSIS — F17210 Nicotine dependence, cigarettes, uncomplicated: Secondary | ICD-10-CM | POA: Diagnosis not present

## 2015-12-09 DIAGNOSIS — J111 Influenza due to unidentified influenza virus with other respiratory manifestations: Secondary | ICD-10-CM | POA: Insufficient documentation

## 2015-12-09 DIAGNOSIS — R05 Cough: Secondary | ICD-10-CM | POA: Diagnosis present

## 2015-12-09 LAB — CBC WITH DIFFERENTIAL/PLATELET
Basophils Absolute: 0 10*3/uL (ref 0.0–0.1)
Basophils Relative: 0 %
Eosinophils Absolute: 0.1 10*3/uL (ref 0.0–0.7)
Eosinophils Relative: 1 %
HCT: 45.1 % (ref 39.0–52.0)
Hemoglobin: 15.8 g/dL (ref 13.0–17.0)
Lymphocytes Relative: 15 %
Lymphs Abs: 1.4 10*3/uL (ref 0.7–4.0)
MCH: 33.1 pg (ref 26.0–34.0)
MCHC: 35 g/dL (ref 30.0–36.0)
MCV: 94.4 fL (ref 78.0–100.0)
Monocytes Absolute: 1.7 10*3/uL — ABNORMAL HIGH (ref 0.1–1.0)
Monocytes Relative: 19 %
Neutro Abs: 5.8 10*3/uL (ref 1.7–7.7)
Neutrophils Relative %: 65 %
Platelets: 210 10*3/uL (ref 150–400)
RBC: 4.78 MIL/uL (ref 4.22–5.81)
RDW: 14.1 % (ref 11.5–15.5)
WBC: 8.9 10*3/uL (ref 4.0–10.5)

## 2015-12-09 LAB — BRAIN NATRIURETIC PEPTIDE: B Natriuretic Peptide: 17 pg/mL (ref 0.0–100.0)

## 2015-12-09 MED ORDER — SODIUM CHLORIDE 0.9 % IV BOLUS (SEPSIS)
1000.0000 mL | Freq: Once | INTRAVENOUS | Status: AC
  Administered 2015-12-09: 1000 mL via INTRAVENOUS

## 2015-12-09 MED ORDER — ACETAMINOPHEN 325 MG PO TABS
ORAL_TABLET | ORAL | Status: AC
  Filled 2015-12-09: qty 2

## 2015-12-09 MED ORDER — OSELTAMIVIR PHOSPHATE 75 MG PO CAPS
75.0000 mg | ORAL_CAPSULE | Freq: Once | ORAL | Status: AC
  Administered 2015-12-09: 75 mg via ORAL
  Filled 2015-12-09: qty 1

## 2015-12-09 MED ORDER — IBUPROFEN 400 MG PO TABS
600.0000 mg | ORAL_TABLET | Freq: Once | ORAL | Status: AC
  Administered 2015-12-09: 600 mg via ORAL
  Filled 2015-12-09: qty 2

## 2015-12-09 MED ORDER — ACETAMINOPHEN 325 MG PO TABS
650.0000 mg | ORAL_TABLET | Freq: Once | ORAL | Status: AC | PRN
  Administered 2015-12-09: 650 mg via ORAL

## 2015-12-09 NOTE — ED Provider Notes (Signed)
[facility]PT Provider Note   CSN: [id number] Arrival date & time: 12/09/15  1816  By signing my name below, I, Christy Sartorius, attest that this documentation has been prepared under the direction and in the presence of Mancel Bale, MD . Electronically Signed: Christy Sartorius, Scribe. 12/09/2015. 10:52 PM.  History   Chief Complaint Chief Complaint  Patient presents with  . Cough   The history is provided by the patient and medical records. No language interpreter was used.     HPI Comments:  Matthew Thompson is a 50 y.o. male who presents to the Emergency Department complaining of generalized body aches, cough and chest wall pain, onset 3 days ago.  He also reports subjective fever.  Pt has not eaten much. He is not taking antibiotics or other medications.   Pt travels a lot for work.  He has not had a flu shot.  No alleviating factors noted.  No additional symptoms or complains.    Past Medical History:  Diagnosis Date  . Hypertension     There are no active problems to display for this patient.   Past Surgical History:  Procedure Laterality Date  . HERNIA REPAIR    . left hand         Home Medications    Prior to Admission medications   Medication Sig Start Date End Date Taking? Authorizing Provider  oseltamivir (TAMIFLU) 75 MG capsule Take 1 capsule (75 mg total) by mouth every 12 (twelve) hours. 12/10/15   Mancel Bale, MD    Family History No family history on file.  Social History Social History  Substance Use Topics  . Smoking status: Current Every Day Smoker    Packs/day: 0.50    Types: Cigarettes  . Smokeless tobacco: Never Used  . Alcohol use Yes     Comment: occassionally     Allergies   Patient has no known allergies.   Review of Systems Review of Systems  Respiratory: Positive for cough.   Musculoskeletal: Positive for myalgias.     Physical Exam Updated Vital Signs BP 145/89 (BP Location: Left Arm)   Pulse 93   Temp  100.2 F (37.9 C)   Resp 22   Ht 6\' 4"  (1.93 m)   Wt 252 lb (114.3 kg)   SpO2 92%   BMI 30.67 kg/m   Physical Exam  Constitutional: He is oriented to person, place, and time. He appears well-developed and well-nourished.  HENT:  Head: Normocephalic and atraumatic.  Right Ear: External ear normal.  Left Ear: External ear normal.  Eyes: Conjunctivae and EOM are normal. Pupils are equal, round, and reactive to light.  Neck: Normal range of motion and phonation normal. Neck supple.  Cardiovascular: Normal rate, regular rhythm and normal heart sounds.   Pulmonary/Chest: Effort normal and breath sounds normal. He exhibits no bony tenderness.  Abdominal: Soft. There is no tenderness.  Musculoskeletal: Normal range of motion.  Neurological: He is alert and oriented to person, place, and time. No cranial nerve deficit or sensory deficit. He exhibits normal muscle tone. Coordination normal.  Skin: Skin is warm, dry and intact.  Psychiatric: He has a normal mood and affect. His behavior is normal. Judgment and thought content normal.  Nursing note and vitals reviewed.    ED Treatments / Results   DIAGNOSTIC STUDIES:  Oxygen Saturation is 92% on RA, low by my interpretation.    COORDINATION OF CARE:  10:50 PM Discussed treatment plan with pt at bedside and pt  agreed to plan.  Labs (all labs ordered are listed, but only abnormal results are displayed) Labs Reviewed  CBC WITH DIFFERENTIAL/PLATELET - Abnormal; Notable for the following:       Result Value   Monocytes Absolute 1.7 (*)    All other components within normal limits  BRAIN NATRIURETIC PEPTIDE    EKG  EKG Interpretation None       Radiology Dg Chest 2 View  Result Date: 12/09/2015 CLINICAL DATA:  50 y/o M; often congestion on Thursday with fever and chills. EXAM: CHEST  2 VIEW COMPARISON:  None. FINDINGS: The heart size and mediastinal contours are within normal limits. Both lungs are clear. The visualized  skeletal structures are unremarkable. IMPRESSION: No active cardiopulmonary disease. Electronically Signed   By: Mitzi HansenLance  Furusawa-Stratton M.D.   On: 12/09/2015 18:51    Procedures Procedures (including critical care time)  Medications Ordered in ED Medications  acetaminophen (TYLENOL) 325 MG tablet (not administered)  acetaminophen (TYLENOL) tablet 650 mg (650 mg Oral Given 12/09/15 1827)  sodium chloride 0.9 % bolus 1,000 mL (0 mLs Intravenous Stopped 12/10/15 0022)  ibuprofen (ADVIL,MOTRIN) tablet 600 mg (600 mg Oral Given 12/09/15 2303)  oseltamivir (TAMIFLU) capsule 75 mg (75 mg Oral Given 12/09/15 2303)     Initial Impression / Assessment and Plan / ED Course  I have reviewed the triage vital signs and the nursing notes.  Pertinent labs & imaging results that were available during my care of the patient were reviewed by me and considered in my medical decision making (see chart for details).  Clinical Course     Medications  acetaminophen (TYLENOL) 325 MG tablet (not administered)  acetaminophen (TYLENOL) tablet 650 mg (650 mg Oral Given 12/09/15 1827)  sodium chloride 0.9 % bolus 1,000 mL (0 mLs Intravenous Stopped 12/10/15 0022)  ibuprofen (ADVIL,MOTRIN) tablet 600 mg (600 mg Oral Given 12/09/15 2303)  oseltamivir (TAMIFLU) capsule 75 mg (75 mg Oral Given 12/09/15 2303)    Patient Vitals for the past 24 hrs:  BP Temp Temp src Pulse Resp SpO2 Height Weight  12/10/15 0019 145/89 100.2 F (37.9 C) - 93 22 92 % - -  12/09/15 2213 151/85 102.4 F (39.1 C) Oral 105 21 92 % - -  12/09/15 1824 168/81 - - - - - - -  12/09/15 1823 - 102 F (38.9 C) Oral (!) 124 20 97 % 6\' 4"  (1.93 m) 252 lb (114.3 kg)    12:31 AM Reevaluation with update and discussion. After initial assessment and treatment, an updated evaluation reveals He is feeling better at this time. Findings discussed with patient, all questions answered. Gedalya Jim L   Final Clinical Impressions(s) / ED Diagnoses    Final diagnoses:  Influenza-like illness   Illness less than 72 hours consistent with influenza. Patient has not been immunized this season. Doubt serious bacterial infection, metabolic instability or impending vascular collapse.  Nursing Notes Reviewed/ Care Coordinated Applicable Imaging Reviewed Interpretation of Laboratory Data incorporated into ED treatment  The patient appears reasonably screened and/or stabilized for discharge and I doubt any other medical condition or other [facility] requiring further screening, evaluation, or treatment in the ED at this time prior to discharge.  Plan: Home Medications- continue; Home Treatments- rest; return here if the recommended treatment, does not improve the symptoms; Recommended follow up- PCP prn   New Prescriptions New Prescriptions   OSELTAMIVIR (TAMIFLU) 75 MG CAPSULE    Take 1 capsule (75 mg total) by mouth every 12 (twelve) hours.  I personally performed the services described in this documentation, which was scribed in my presence. The recorded information has been reviewed and is accurate.    Mancel BaleElliott Shaquila Sigman, MD 12/10/15 [id number]

## 2015-12-09 NOTE — ED Triage Notes (Signed)
Pt states he began having a cough and congestion on Thursday. Pt has had fevers and chills. He last took Theraflu 4 hours ago. Temp 102.0 in triage. States he had an episode of emesis last night.

## 2015-12-10 MED ORDER — OSELTAMIVIR PHOSPHATE 75 MG PO CAPS: 75.0000 mg | ORAL_CAPSULE | Freq: Two times a day (BID) | ORAL | 0 refills | Status: DC

## 2015-12-10 NOTE — Discharge Instructions (Signed)
Get plenty of rest, and drink a lot of fluids.  Follow-up with the doctor or your choice if not better in 3 or 4 days

## 2016-08-10 ENCOUNTER — Emergency Department (HOSPITAL_COMMUNITY): Payer: No Typology Code available for payment source

## 2016-08-10 ENCOUNTER — Emergency Department (HOSPITAL_COMMUNITY)
Admission: EM | Admit: 2016-08-10 | Discharge: 2016-08-10 | Disposition: A | Discharge status: Home / Self Care | Payer: No Typology Code available for payment source | Attending: Emergency Medicine | Admitting: Emergency Medicine

## 2016-08-10 ENCOUNTER — Encounter (HOSPITAL_COMMUNITY): Payer: Self-pay | Admitting: Adult Health

## 2016-08-10 DIAGNOSIS — M5416 Radiculopathy, lumbar region: Secondary | ICD-10-CM

## 2016-08-10 DIAGNOSIS — F17210 Nicotine dependence, cigarettes, uncomplicated: Secondary | ICD-10-CM | POA: Insufficient documentation

## 2016-08-10 DIAGNOSIS — I10 Essential (primary) hypertension: Secondary | ICD-10-CM | POA: Insufficient documentation

## 2016-08-10 DIAGNOSIS — Y9389 Activity, other specified: Secondary | ICD-10-CM | POA: Insufficient documentation

## 2016-08-10 DIAGNOSIS — Y929 Unspecified place or not applicable: Secondary | ICD-10-CM | POA: Insufficient documentation

## 2016-08-10 DIAGNOSIS — M5417 Radiculopathy, lumbosacral region: Secondary | ICD-10-CM | POA: Insufficient documentation

## 2016-08-10 DIAGNOSIS — Y990 Civilian activity done for income or pay: Secondary | ICD-10-CM | POA: Insufficient documentation

## 2016-08-10 MED ORDER — OXYCODONE-ACETAMINOPHEN 5-325 MG PO TABS: 1.0000 | ORAL_TABLET | ORAL | 0 refills | Status: DC | PRN

## 2016-08-10 MED ORDER — OXYCODONE-ACETAMINOPHEN 5-325 MG PO TABS
1.0000 | ORAL_TABLET | Freq: Once | ORAL | Status: AC
  Administered 2016-08-10: 1 via ORAL
  Filled 2016-08-10: qty 1

## 2016-08-10 MED ORDER — CYCLOBENZAPRINE HCL 10 MG PO TABS: 10.0000 mg | ORAL_TABLET | Freq: Two times a day (BID) | ORAL | 0 refills | Status: DC | PRN

## 2016-08-10 MED ORDER — PREDNISONE 20 MG PO TABS: 20.0000 mg | ORAL_TABLET | Freq: Every day | ORAL | 0 refills | Status: DC

## 2016-08-10 MED ORDER — CYCLOBENZAPRINE HCL 10 MG PO TABS
10.0000 mg | ORAL_TABLET | Freq: Once | ORAL | Status: AC
  Administered 2016-08-10: 10 mg via ORAL
  Filled 2016-08-10: qty 1

## 2016-08-10 MED ORDER — PREDNISONE 20 MG PO TABS
40.0000 mg | ORAL_TABLET | Freq: Once | ORAL | Status: AC
  Administered 2016-08-10: 40 mg via ORAL
  Filled 2016-08-10: qty 2

## 2016-08-10 NOTE — ED Notes (Signed)
ED Provider at bedside. 
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
## 2016-08-10 NOTE — Discharge Instructions (Signed)
You will need an MRI of your lower back. RN will instruct you concerning the time. Prescriptions for pain medicine, muscle relaxer, steroid.

## 2016-08-10 NOTE — ED Notes (Signed)
Patient transported to X-ray 
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
## 2016-08-12 ENCOUNTER — Ambulatory Visit (HOSPITAL_COMMUNITY)
Admission: RE | Admit: 2016-08-12 | Discharge: 2016-08-12 | Disposition: A | Discharge status: Home / Self Care | Payer: Self-pay | Source: Ambulatory Visit | Attending: Emergency Medicine | Admitting: Emergency Medicine

## 2016-08-12 DIAGNOSIS — M545 Low back pain: Secondary | ICD-10-CM | POA: Insufficient documentation

## 2016-08-12 DIAGNOSIS — M79604 Pain in right leg: Secondary | ICD-10-CM | POA: Insufficient documentation

## 2016-08-12 DIAGNOSIS — M5186 Other intervertebral disc disorders, lumbar region: Secondary | ICD-10-CM | POA: Insufficient documentation

## 2018-04-11 ENCOUNTER — Encounter: Payer: Self-pay | Admitting: Family Medicine

## 2018-04-13 ENCOUNTER — Encounter: Payer: Self-pay | Admitting: Family Medicine

## 2018-04-13 ENCOUNTER — Ambulatory Visit (INDEPENDENT_AMBULATORY_CARE_PROVIDER_SITE_OTHER): Payer: PRIVATE HEALTH INSURANCE | Admitting: Family Medicine

## 2018-04-13 ENCOUNTER — Other Ambulatory Visit: Payer: Self-pay

## 2018-04-13 VITALS — BP 140/78 | HR 96 | Temp 98.9°F | Ht 76.0 in | Wt 244.5 lb

## 2018-04-13 DIAGNOSIS — E119 Type 2 diabetes mellitus without complications: Secondary | ICD-10-CM | POA: Insufficient documentation

## 2018-04-13 LAB — GLUCOSE HEMOCUE WAIVED: Glu Hemocue Waived: 408 mg/dL (ref 65–99)

## 2018-04-13 LAB — URINALYSIS
Bilirubin, UA: NEGATIVE
Glucose, UA: NEGATIVE
Ketones, UA: NEGATIVE
Leukocytes, UA: NEGATIVE
Nitrite, UA: NEGATIVE
Protein, UA: NEGATIVE
RBC, UA: NEGATIVE
Specific Gravity, UA: 1.01 (ref 1.005–1.030)
Urobilinogen, Ur: 0.2 mg/dL (ref 0.2–1.0)
pH, UA: 8 — ABNORMAL HIGH (ref 5.0–7.5)

## 2018-04-13 LAB — BAYER DCA HB A1C WAIVED: HB A1C (BAYER DCA - WAIVED): 11.9 % — ABNORMAL HIGH (ref <7.0)

## 2018-04-13 MED ORDER — FREESTYLE LIBRE 14 DAY SENSOR MISC: 1.0000 | 2 refills | Status: DC

## 2018-04-13 MED ORDER — SITAGLIP PHOS-METFORMIN HCL ER 100-1000 MG PO TB24: 1.0000 | ORAL_TABLET | Freq: Every day | ORAL | 1 refills | Status: DC

## 2018-04-13 MED ORDER — FREESTYLE LIBRE 14 DAY READER DEVI: 1.0000 | Freq: Two times a day (BID) | 0 refills | Status: DC

## 2018-04-13 NOTE — Progress Notes (Signed)
Subjective:  Patient ID: Matthew Thompson, male    DOB: Mar 24, 1965  Age: 53 y.o. MRN: [id number]  CC: New Patient (Initial Visit)   HPI Matthew Thompson presents for new pt. Evaluation of diabetes.  Blood sugar elevated to 500+ on 2 recent checks.  This represents a rather severe condition.  He is here today to establish care and start treatment.  He does not want to "claim "the disease.  He has had some blurring of his vision.  He has been excessively hungry and had frequency of urination as well as polydipsia. History Matthew Thompson has a past medical history of Diabetes mellitus without complication (Matthew Thompson) and Hypertension.   He has a past surgical history that includes left hand and Hernia repair.   His family history includes Cancer in his father and mother; Diabetes in his brother, father, mother, and sister; Heart disease in his mother; Hypertension in his brother, father, mother, and sister; Stroke in his mother.He reports that he has been smoking cigarettes. He has a 10.50 pack-year smoking history. He has never used smokeless tobacco. He reports current alcohol use. He reports that he does not use drugs.  No current outpatient medications on file prior to visit.   No current facility-administered medications on file prior to visit.     ROS Review of Systems  Constitutional: Negative.   HENT: Negative.   Eyes: Negative for visual disturbance.  Respiratory: Negative for cough and shortness of breath.   Cardiovascular: Negative for chest pain and leg swelling.  Gastrointestinal: Negative for abdominal pain, diarrhea, nausea and vomiting.  Genitourinary: Negative for difficulty urinating.  Musculoskeletal: Positive for back pain (Lumbar region chronic). Negative for arthralgias and myalgias.  Skin: Negative for rash.  Neurological: Negative for headaches.  Psychiatric/Behavioral: Negative for sleep disturbance.    Objective:  BP 140/78   Pulse 96   Temp 98.9 F (37.2 C) (Oral)    Ht 6' 4" (1.93 m)   Wt 244 lb 8 oz (110.9 kg)   BMI 29.76 kg/m   BP Readings from Last 3 Encounters:  04/13/18 140/78  08/10/16 (!) 162/77  12/10/15 145/89    Wt Readings from Last 3 Encounters:  04/13/18 244 lb 8 oz (110.9 kg)  08/10/16 256 lb (116.1 kg)  12/09/15 252 lb (114.3 kg)     Physical Exam Constitutional:      General: He is not in acute distress.    Appearance: He is well-developed.  HENT:     Head: Normocephalic and atraumatic.     Right Ear: External ear normal.     Left Ear: External ear normal.     Nose: Nose normal.  Eyes:     Conjunctiva/sclera: Conjunctivae normal.     Pupils: Pupils are equal, round, and reactive to light.  Neck:     Musculoskeletal: Normal range of motion and neck supple.  Cardiovascular:     Rate and Rhythm: Normal rate and regular rhythm.     Heart sounds: Normal heart sounds. No murmur.  Pulmonary:     Effort: Pulmonary effort is normal. No respiratory distress.     Breath sounds: Normal breath sounds. No wheezing or rales.  Abdominal:     Palpations: Abdomen is soft.     Tenderness: There is no abdominal tenderness.  Musculoskeletal: Normal range of motion.  Skin:    General: Skin is warm and dry.  Neurological:     Mental Status: He is alert and oriented to person, place, and time.  Deep Tendon Reflexes: Reflexes are normal and symmetric.  Psychiatric:        Behavior: Behavior normal.        Thought Content: Thought content normal.        Judgment: Judgment normal.       Assessment & Plan:   Matthew Thompson was seen today for new patient (initial visit).  Diagnoses and all orders for this visit:  Diabetes mellitus, new onset ([place]) -     CBC with Differential/Platelet -     CMP14+EGFR -     Lipid panel -     Urinalysis -     Bayer DCA Hb A1c Waived -     Glucose Hemocue Waived  Other orders -     Continuous Blood Gluc Receiver (FREESTYLE LIBRE 14 DAY READER) DEVI; 1 each by Does not apply route 2 (two) times  daily. -     Continuous Blood Gluc Sensor (FREESTYLE LIBRE 14 DAY SENSOR) MISC; 1 each by Does not apply route every 14 (fourteen) days. -     SitaGLIPtin-MetFORMIN HCl [id number] MG TB24; Take 1 tablet by mouth daily.      I have discontinued Matthew Thompson's oxyCODONE-acetaminophen, cyclobenzaprine, predniSONE, oxyCODONE-acetaminophen, and cyclobenzaprine. I am also having him start on FreeStyle Libre 14 Day Reader, FreeStyle Libre 14 Day Sensor, and SitaGLIPtin-MetFORMIN HCl.  Meds ordered this encounter  Medications  . Continuous Blood Gluc Receiver (FREESTYLE LIBRE 14 DAY READER) DEVI    Sig: 1 each by Does not apply route 2 (two) times daily.    Dispense:  1 Device    Refill:  0  . Continuous Blood Gluc Sensor (FREESTYLE LIBRE 14 DAY SENSOR) MISC    Sig: 1 each by Does not apply route every 14 (fourteen) days.    Dispense:  2 each    Refill:  2  . SitaGLIPtin-MetFORMIN HCl [id number] MG TB24    Sig: Take 1 tablet by mouth daily.    Dispense:  90 tablet    Refill:  1     Follow-up: Return in about 2 weeks (around 04/27/2018).  Claretta Fraise, M.D.

## 2018-04-13 NOTE — Patient Instructions (Signed)
Carbohydrate Counting for Diabetes Mellitus, Adult  Carbohydrate counting is a method of keeping track of how many carbohydrates you eat. Eating carbohydrates naturally increases the amount of sugar (glucose) in the blood. Counting how many carbohydrates you eat helps keep your blood glucose within normal limits, which helps you manage your diabetes (diabetes mellitus). It is important to know how many carbohydrates you can safely have in each meal. This is different for every person. A diet and nutrition specialist (registered dietitian) can help you make a meal plan and calculate how many carbohydrates you should have at each meal and snack. Carbohydrates are found in the following foods:  Grains, such as breads and cereals.  Dried beans and soy products.  Starchy vegetables, such as potatoes, peas, and corn.  Fruit and fruit juices.  Milk and yogurt.  Sweets and snack foods, such as cake, cookies, candy, chips, and soft drinks. How do I count carbohydrates? There are two ways to count carbohydrates in food. You can use either of the methods or a combination of both. Reading "Nutrition Facts" on packaged food The "Nutrition Facts" list is included on the labels of almost all packaged foods and beverages in the [place] It includes:  The serving size.  Information about nutrients in each serving, including the grams (g) of carbohydrate per serving. To use the Nutrition Facts":  Decide how many servings you will have.  Multiply the number of servings by the number of carbohydrates per serving.  The resulting number is the total amount of carbohydrates that you will be having. Learning standard serving sizes of other foods When you eat carbohydrate foods that are not packaged or do not include "Nutrition Facts" on the label, you need to measure the servings in order to count the amount of carbohydrates:  Measure the foods that you will eat with a food scale or measuring cup, if  needed.  Decide how many standard-size servings you will eat.  Multiply the number of servings by 15. Most carbohydrate-rich foods have about 15 g of carbohydrates per serving. ? For example, if you eat 8 oz (170 g) of strawberries, you will have eaten 2 servings and 30 g of carbohydrates (2 servings x 15 g = 30 g).  For foods that have more than one food mixed, such as soups and casseroles, you must count the carbohydrates in each food that is included. The following list contains standard serving sizes of common carbohydrate-rich foods. Each of these servings has about 15 g of carbohydrates:   hamburger bun or  English muffin.   oz (15 mL) syrup.   oz (14 g) jelly.  1 slice of bread.  1 six-inch tortilla.  3 oz (85 g) cooked rice or pasta.  4 oz (113 g) cooked dried beans.  4 oz (113 g) starchy vegetable, such as peas, corn, or potatoes.  4 oz (113 g) hot cereal.  4 oz (113 g) mashed potatoes or  of a large baked potato.  4 oz (113 g) canned or frozen fruit.  4 oz (120 mL) fruit juice.  4-6 crackers.  6 chicken nuggets.  6 oz (170 g) unsweetened dry cereal.  6 oz (170 g) plain fat-free yogurt or yogurt sweetened with artificial sweeteners.  8 oz (240 mL) milk.  8 oz (170 g) fresh fruit or one small piece of fruit.  24 oz (680 g) popped popcorn. Example of carbohydrate counting Sample meal  3 oz (85 g) chicken breast.  6 oz (170 g)  brown rice.  4 oz (113 g) corn.  8 oz (240 mL) milk.  8 oz (170 g) strawberries with sugar-free whipped topping. Carbohydrate calculation 1. Identify the foods that contain carbohydrates: ? Rice. ? Corn. ? Milk. ? Strawberries. 2. Calculate how many servings you have of each food: ? 2 servings rice. ? 1 serving corn. ? 1 serving milk. ? 1 serving strawberries. 3. Multiply each number of servings by 15 g: ? 2 servings rice x 15 g = 30 g. ? 1 serving corn x 15 g = 15 g. ? 1 serving milk x 15 g = 15 g. ? 1  serving strawberries x 15 g = 15 g. 4. Add together all of the amounts to find the total grams of carbohydrates eaten: ? 30 g + 15 g + 15 g + 15 g = 75 g of carbohydrates total. Summary  Carbohydrate counting is a method of keeping track of how many carbohydrates you eat.  Eating carbohydrates naturally increases the amount of sugar (glucose) in the blood.  Counting how many carbohydrates you eat helps keep your blood glucose within normal limits, which helps you manage your diabetes.  A diet and nutrition specialist (registered dietitian) can help you make a meal plan and calculate how many carbohydrates you should have at each meal and snack. This information is not intended to replace advice given to you by your health care provider. Make sure you discuss any questions you have with your health care provider. Document Released: 01/19/2005 Document Revised: 07/29/2016 Document Reviewed: 07/03/2015 Elsevier Interactive Patient Education  2019 [employer] Back Exercises If you have pain in your back, do these exercises 2-3 times each day or as told by your doctor. When the pain goes away, do the exercises once each day, but repeat the steps more times for each exercise (do more repetitions). If you do not have pain in your back, do these exercises once each day or as told by your doctor. Exercises Single Knee to Chest Do these steps 3-5 times in a row for each leg: 1. Lie on your back on a firm bed or the floor with your legs stretched out. 2. Bring one knee to your chest. 3. Hold your knee to your chest by grabbing your knee or thigh. 4. Pull on your knee until you feel a gentle stretch in your lower back. 5. Keep doing the stretch for 10-30 seconds. 6. Slowly let go of your leg and straighten it. Pelvic Tilt Do these steps 5-10 times in a row: 1. Lie on your back on a firm bed or the floor with your legs stretched out. 2. Bend your knees so they point up to the ceiling. Your feet  should be flat on the floor. 3. Tighten your lower belly (abdomen) muscles to press your lower back against the floor. This will make your tailbone point up to the ceiling instead of pointing down to your feet or the floor. 4. Stay in this position for 5-10 seconds while you gently tighten your muscles and breathe evenly. Cat-Cow Do these steps until your lower back bends more easily: 1. Get on your hands and knees on a firm surface. Keep your hands under your shoulders, and keep your knees under your hips. You may put padding under your knees. 2. Let your head hang down, and make your tailbone point down to the floor so your lower back is round like the back of a cat. 3. Stay in this position for  5 seconds. 4. Slowly lift your head and make your tailbone point up to the ceiling so your back hangs low (sags) like the back of a cow. 5. Stay in this position for 5 seconds.  Press-Ups Do these steps 5-10 times in a row: 1. Lie on your belly (face-down) on the floor. 2. Place your hands near your head, about shoulder-width apart. 3. While you keep your back relaxed and keep your hips on the floor, slowly straighten your arms to raise the top half of your body and lift your shoulders. Do not use your back muscles. To make yourself more comfortable, you may change where you place your hands. 4. Stay in this position for 5 seconds. 5. Slowly return to lying flat on the floor.  Bridges Do these steps 10 times in a row: 1. Lie on your back on a firm surface. 2. Bend your knees so they point up to the ceiling. Your feet should be flat on the floor. 3. Tighten your butt muscles and lift your butt off of the floor until your waist is almost as high as your knees. If you do not feel the muscles working in your butt and the back of your thighs, slide your feet 1-2 inches farther away from your butt. 4. Stay in this position for 3-5 seconds. 5. Slowly lower your butt to the floor, and let your butt muscles  relax. If this exercise is too easy, try doing it with your arms crossed over your chest. Belly Crunches Do these steps 5-10 times in a row: 1. Lie on your back on a firm bed or the floor with your legs stretched out. 2. Bend your knees so they point up to the ceiling. Your feet should be flat on the floor. 3. Cross your arms over your chest. 4. Tip your chin a little bit toward your chest but do not bend your neck. 5. Tighten your belly muscles and slowly raise your chest just enough to lift your shoulder blades a tiny bit off of the floor. 6. Slowly lower your chest and your head to the floor. Back Lifts Do these steps 5-10 times in a row: 1. Lie on your belly (face-down) with your arms at your sides, and rest your forehead on the floor. 2. Tighten the muscles in your legs and your butt. 3. Slowly lift your chest off of the floor while you keep your hips on the floor. Keep the back of your head in line with the curve in your back. Look at the floor while you do this. 4. Stay in this position for 3-5 seconds. 5. Slowly lower your chest and your face to the floor. Contact a doctor if:  Your back pain gets a lot worse when you do an exercise.  Your back pain does not lessen 2 hours after you exercise. If you have any of these problems, stop doing the exercises. Do not do them again unless your doctor says it is okay. Get help right away if:  You have sudden, very bad back pain. If this happens, stop doing the exercises. Do not do them again unless your doctor says it is okay. This information is not intended to replace advice given to you by your health care provider. Make sure you discuss any questions you have with your health care provider. Document Released: 02/21/2010 Document Revised: 10/13/2017 Document Reviewed: 03/15/2014 Elsevier Interactive Patient Education  [employer].

## 2018-04-14 LAB — CMP14+EGFR
ALT: 29 IU/L (ref 0–44)
AST: 16 IU/L (ref 0–40)
Albumin/Globulin Ratio: 1.7 (ref 1.2–2.2)
Albumin: 5 g/dL (ref 4.0–5.0)
Alkaline Phosphatase: 145 IU/L — ABNORMAL HIGH (ref 39–117)
BUN/Creatinine Ratio: 10 (ref 9–20)
BUN: 11 mg/dL (ref 6–24)
Bilirubin Total: 0.5 mg/dL (ref 0.0–1.2)
CO2: 26 mmol/L (ref 20–29)
Calcium: 10.4 mg/dL — ABNORMAL HIGH (ref 8.7–10.2)
Chloride: 88 mmol/L — ABNORMAL LOW (ref 96–106)
Creatinine, Ser: 1.13 mg/dL (ref 0.76–1.27)
GFR calc Af Amer: 87 mL/min/{1.73_m2} (ref >59)
GFR calc non Af Amer: 75 mL/min/{1.73_m2} (ref >59)
Globulin, Total: 2.9 g/dL (ref 1.5–4.5)
Glucose: 411 mg/dL (ref 65–99)
Potassium: 5 mmol/L (ref 3.5–5.2)
Sodium: 130 mmol/L — ABNORMAL LOW (ref 134–144)
Total Protein: 7.9 g/dL (ref 6.0–8.5)

## 2018-04-14 LAB — CBC WITH DIFFERENTIAL/PLATELET
Basophils Absolute: 0.1 10*3/uL (ref 0.0–0.2)
Basos: 1 %
EOS (ABSOLUTE): 0.2 10*3/uL (ref 0.0–0.4)
Eos: 2 %
Hematocrit: 46.2 % (ref 37.5–51.0)
Hemoglobin: 16.8 g/dL (ref 13.0–17.7)
Immature Grans (Abs): 0 10*3/uL (ref 0.0–0.1)
Immature Granulocytes: 0 %
Lymphocytes Absolute: 3.7 10*3/uL — ABNORMAL HIGH (ref 0.7–3.1)
Lymphs: 40 %
MCH: 32.8 pg (ref 26.6–33.0)
MCHC: 36.4 g/dL — ABNORMAL HIGH (ref 31.5–35.7)
MCV: 90 fL (ref 79–97)
Monocytes Absolute: 1 10*3/uL — ABNORMAL HIGH (ref 0.1–0.9)
Monocytes: 11 %
Neutrophils Absolute: 4.2 10*3/uL (ref 1.4–7.0)
Neutrophils: 46 %
Platelets: 280 10*3/uL (ref 150–450)
RBC: 5.12 x10E6/uL (ref 4.14–5.80)
RDW: 12.7 % (ref 11.6–15.4)
WBC: 9.2 10*3/uL (ref 3.4–10.8)

## 2018-04-14 LAB — LIPID PANEL
Chol/HDL Ratio: 9.4 ratio — ABNORMAL HIGH (ref 0.0–5.0)
Cholesterol, Total: 255 mg/dL — ABNORMAL HIGH (ref 100–199)
HDL: 27 mg/dL — ABNORMAL LOW (ref >39)
Triglycerides: 829 mg/dL (ref 0–149)

## 2018-04-15 ENCOUNTER — Telehealth: Payer: Self-pay | Admitting: Family Medicine

## 2018-04-15 NOTE — Telephone Encounter (Signed)
xxx

## 2018-04-18 ENCOUNTER — Ambulatory Visit: Payer: Self-pay | Admitting: Family Medicine

## 2018-04-19 ENCOUNTER — Encounter: Payer: Self-pay | Admitting: Family Medicine

## 2018-04-27 ENCOUNTER — Telehealth (INDEPENDENT_AMBULATORY_CARE_PROVIDER_SITE_OTHER): Payer: PRIVATE HEALTH INSURANCE | Admitting: Family Medicine

## 2018-04-27 ENCOUNTER — Other Ambulatory Visit: Payer: Self-pay

## 2018-04-27 DIAGNOSIS — E119 Type 2 diabetes mellitus without complications: Secondary | ICD-10-CM

## 2018-04-27 NOTE — Progress Notes (Signed)
Subjective:  Patient ID: Matthew Thompson, male    DOB: 1965-07-09  Age: 53 y.o. MRN: [id number]  CC: No chief complaint on file.   HPI MERLIN WHEATON presents forFollow-up of diabetes. Patient checks blood sugar at home.  170 fasting and 300 postprandial Patient denies symptoms such as polyuria, polydipsia, excessive hunger, nausea No significant hypoglycemic spells noted. Medications reviewed. Pt reports taking them regularly without complication/adverse reaction being reported today.  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  He is currently off duty because of the elevated A1c.  He is checking his blood sugar twice daily he was he had to get a traditional fingerstick monitor as the freestyle was not covered by his insurance.  However he is adapting well to that and does not express any of his initial concern about fingerstick testing.  History [place] has a past medical history of Diabetes mellitus without complication ([facility]) and Hypertension.   He has a past surgical history that includes left hand and Hernia repair.   His family history includes Cancer in his father and mother; Diabetes in his brother, father, mother, and sister; Heart disease in his mother; Hypertension in his brother, father, mother, and sister; Stroke in his mother.He reports that he has been smoking cigarettes. He has a 10.50 pack-year smoking history. He has never used smokeless tobacco. He reports current alcohol use. He reports that he does not use drugs.  Current Outpatient Medications on File Prior to Visit  Medication Sig Dispense Refill  . SitaGLIPtin-MetFORMIN HCl [id number] MG TB24 Take 1 tablet by mouth daily. 90 tablet 1   No current facility-administered medications on file prior to visit.     ROS Review of Systems Review of Systems - General ROS: negative ENT ROS: negative for - headaches, nasal congestion or nasal discharge Endocrine ROS: negative for - malaise/lethargy, palpitations or  polydipsia/polyuria Respiratory ROS: no cough, shortness of breath, or wheezing Cardiovascular ROS: no chest pain or dyspnea on exertion Gastrointestinal ROS: no abdominal pain, change in bowel habits, or black or bloody stools Musculoskeletal ROS: negative for - joint pain, joint swelling or muscular weakness  Objective:  There were no vitals taken for this visit.  BP Readings from Last 3 Encounters:  04/13/18 140/78  08/10/16 (!) 162/77  12/10/15 145/89    Wt Readings from Last 3 Encounters:  04/13/18 244 lb 8 oz (110.9 kg)  08/10/16 256 lb (116.1 kg)  12/09/15 252 lb (114.3 kg)     Physical Exam  This visit was conducted by phone therefore no physical exam could be completed.  Assessment & Plan:   Diagnoses and all orders for this visit:  Diabetes mellitus, new onset ([facility])      I have discontinued Hosea [employer]. Gartley's FreeStyle Libre 14 Day Reader and [employer] 14 Day Sensor. I am also having him maintain his SitaGLIPtin-MetFORMIN HCl.  No orders of the defined types were placed in this encounter.     Virtual Visit via telephone Note  I connected with [name] on 04/27/18 at 10:59 by telephone and verified that I am speaking with the correct person using two identifiers. [name] is currently located at home and wife is currently with her during visit. The provider, Mechele Claude, MD is located in his office at time of visit.  I discussed the limitations, risks, security and privacy concerns of performing an evaluation and management service by telephone and the availability of in person appointments. I  also discussed with the patient that there may be a patient responsible charge related to this service. The patient expressed understanding and agreed to proceed.   Follow Up Instructions:  The patient is cleared to go ahead and start driving again due to his compliance with treatment plan using medication and sugars dropping although they are still  not at goal.  We discussed in detail the need to exercise daily and the need to lose about 20 to 25 pounds through diet.  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  He tells me he has a refrigerator in his truck.  He is willing to eat fresh vegetables and I suggested snacks such as hummus fresh fruit etc. he is willing to explore some options.  Even peanut butter this point when he is on the road would provide him some good protein.  Just limited amounts I recommended 2 tablespoons per serving for now   I discussed the assessment and treatment plan with the patient. The patient was provided an opportunity to ask questions and all were answered. The patient agreed with the plan and demonstrated an understanding of the instructions.   The patient was advised to call back or seek an in-person evaluation if the symptoms worsen or if the condition fails to improve as anticipated.  The above assessment and management plan was discussed with the patient. The patient verbalized understanding of and has agreed to the management plan. Patient is aware to call the clinic if symptoms persist or worsen. Patient is aware when to return to the clinic for a follow-up visit. Patient educated on when it is appropriate to go to the emergency department.    I provided 14 minutes of non-face-to-face time during this encounter. Visit ended at 11:13.    Mechele Claude, MD    Follow-up:  One month  Mechele Claude, M.D.

## 2018-05-18 ENCOUNTER — Other Ambulatory Visit: Payer: Self-pay | Admitting: Family Medicine

## 2018-05-26 ENCOUNTER — Ambulatory Visit (INDEPENDENT_AMBULATORY_CARE_PROVIDER_SITE_OTHER): Payer: PRIVATE HEALTH INSURANCE | Admitting: Nurse Practitioner

## 2018-05-26 ENCOUNTER — Encounter: Payer: Self-pay | Admitting: Nurse Practitioner

## 2018-05-26 ENCOUNTER — Other Ambulatory Visit: Payer: Self-pay

## 2018-05-26 DIAGNOSIS — J0100 Acute maxillary sinusitis, unspecified: Secondary | ICD-10-CM | POA: Diagnosis not present

## 2018-05-26 MED ORDER — AMOXICILLIN-POT CLAVULANATE 875-125 MG PO TABS: 1.0000 | ORAL_TABLET | Freq: Two times a day (BID) | ORAL | 0 refills | Status: DC

## 2018-05-26 NOTE — Progress Notes (Signed)
Patient ID: Matthew Thompson, male   DOB: 1965-11-14, 53 y.o.   MRN: [id number]    Virtual Visit via telephone Note  I connected with Matthew Thompson on 05/26/18 at 2:40PM by telephone and verified that I am speaking with the correct person using two identifiers. Matthew Thompson is currently located at home and his son is currently with her during visit. The provider, Mary-Margaret Daphine Deutscher, FNP is located in their office at time of visit.  I discussed the limitations, risks, security and privacy concerns of performing an evaluation and management service by telephone and the availability of in person appointments. I also discussed with the patient that there may be a patient responsible charge related to this service. The patient expressed understanding and agreed to proceed.   History and Present Illness:   Chief Complaint: Sinusitis   HPI Patient calls in today c/o congestion and runny nose. It has developed into sinus infection. Facial pressure and green nasal discharge. Stated about 2 weeks ago. Has gotten worse. OTC allergy meds have not helped     Review of Systems  Constitutional: Negative for chills and fever.  HENT: Positive for congestion, sinus pain and sore throat. Negative for ear pain.   Respiratory: Negative for cough.   Cardiovascular: Negative.   Genitourinary: Negative.   Musculoskeletal: Negative.   Neurological: Positive for headaches.  Psychiatric/Behavioral: Negative.   All other systems reviewed and are negative.    Observations/Objective: Alert and oriented- answers all questions appropriately No distress noted in voice  Assessment and Plan: Matthew Thompson in today with chief complaint of Sinusitis   1. Acute maxillary sinusitis, recurrence not specified Meds ordered this encounter  Medications  . amoxicillin-clavulanate (AUGMENTIN) 875-125 MG tablet    Sig: Take 1 tablet by mouth 2 (two) times daily.    Dispense:  14 tablet    Refill:  0    Order  Specific Question:   Supervising Provider    Answer:   Arville Care A [[id number]]   1. Take meds as prescribed 2. Use a cool mist humidifier especially during the winter months and when heat has been humid. 3. Use saline nose sprays frequently 4. Saline irrigations of the nose can be very helpful if done frequently.  * 4X daily for 1 week*  * Use of a nettie pot can be helpful with this. Follow directions with this* 5. Drink plenty of fluids 6. Keep thermostat turn down low 7.For any cough or congestion  Use plain Mucinex- regular strength or max strength is fine   * Children- consult with Pharmacist for dosing 8. For fever or aces or pains- take tylenol or ibuprofen appropriate for age and weight.  * for fevers greater than 101 orally you may alternate ibuprofen and tylenol every  3 hours.      Follow Up Instructions: prn    I discussed the assessment and treatment plan with the patient. The patient was provided an opportunity to ask questions and all were answered. The patient agreed with the plan and demonstrated an understanding of the instructions.   The patient was advised to call back or seek an in-person evaluation if the symptoms worsen or if the condition fails to improve as anticipated.  The above assessment and management plan was discussed with the patient. The patient verbalized understanding of and has agreed to the management plan. Patient is aware to call the clinic if symptoms persist or worsen. Patient is aware when to return to the  clinic for a follow-up visit. Patient educated on when it is appropriate to go to the emergency department.    I provided 14 minutes of non-face-to-face time during this encounter.    Mary-Margaret Daphine DeutscherMartin, FNP

## 2018-05-31 ENCOUNTER — Ambulatory Visit (INDEPENDENT_AMBULATORY_CARE_PROVIDER_SITE_OTHER): Payer: PRIVATE HEALTH INSURANCE | Admitting: Family Medicine

## 2018-05-31 ENCOUNTER — Other Ambulatory Visit: Payer: Self-pay

## 2018-05-31 ENCOUNTER — Encounter: Payer: Self-pay | Admitting: Family Medicine

## 2018-05-31 DIAGNOSIS — E119 Type 2 diabetes mellitus without complications: Secondary | ICD-10-CM

## 2018-05-31 NOTE — Progress Notes (Signed)
    Subjective:    Patient ID: Matthew Thompson, male    DOB: 03/08/1965, 53 y.o.   MRN: [id number]   HPI: Matthew Thompson is a 53 y.o. 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 Taking food with him. Having elevated glucose. This AM 288. Some have been as low as 190. 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 Taking food, but the length of the drive accompanied by strict deadlines prevents him from taking time to eat or rest properly. It is a 13 hour push from here to [place]. Ban on driving over 10 hours was lifted to allow for longer hauls due to the COVID crisis. Needs note for 3/11 thru 25 and for 4-28 through 06/14/2018. Pt. Needs time to work on glucose control.Glucose did well while off work last month, but is rebounding. Pt. Concerned about  The risk of COVID to him driving to a highly impacted area with a condition that places him at higher risk.   Depression screen PHQ 2/9 04/13/2018  Decreased Interest 0  Down, Depressed, Hopeless 0  PHQ - 2 Score 0     Relevant past medical, surgical, family and social history reviewed and updated as indicated.  Interim medical history since our last visit reviewed. Allergies and medications reviewed and updated.  ROS:  Review of Systems  Constitutional: Negative for fever.  Respiratory: Negative for shortness of breath.   Cardiovascular: Negative for chest pain.  Musculoskeletal: Negative for arthralgias.  Skin: Negative for rash.     Social History   Tobacco Use  Smoking Status Current Every Day Smoker  . Packs/day: 0.50  . Years: 21.00  . Pack years: 10.50  . Types: Cigarettes  Smokeless Tobacco Never Used       Objective:     Wt Readings from Last 3 Encounters:  04/13/18 244 lb 8 oz (110.9 kg)  08/10/16 256 lb (116.1 kg)  12/09/15 252 lb (114.3 kg)     Exam deferred. Pt. Harboring due to COVID 19. Phone visit performed.   Assessment & Plan:   1. Diabetes mellitus, new  onset (HCC)     No orders of the defined types were placed in this encounter.   No orders of the defined types were placed in this encounter.     Diagnoses and all orders for this visit:  Diabetes mellitus, new onset (HCC)    Virtual Visit via telephone Note  I discussed the limitations, risks, security and privacy concerns of performing an evaluation and management service by telephone and the availability of in person appointments. The patient was identified with two identifiers. Pt.expressed understanding and agreed to proceed. Pt. Is at home. Dr. Darlyn Read is in his office.  Follow Up Instructions:   I discussed the assessment and treatment plan with the patient. The patient was provided an opportunity to ask questions and all were answered. The patient agreed with the plan and demonstrated an understanding of the instructions.   The patient was advised to call back or seek an in-person evaluation if the symptoms worsen or if the condition fails to improve as anticipated.  Visit started: 8:27 Call ended:  8:38 Total minutes including chart review and phone contact time: 16   Follow up plan: Return in about 2 weeks (around 06/14/2018).  Mechele Claude, MD [facility]

## 2018-06-13 ENCOUNTER — Other Ambulatory Visit: Payer: Self-pay

## 2018-06-14 ENCOUNTER — Ambulatory Visit (INDEPENDENT_AMBULATORY_CARE_PROVIDER_SITE_OTHER): Payer: PRIVATE HEALTH INSURANCE | Admitting: Family Medicine

## 2018-06-14 ENCOUNTER — Encounter: Payer: Self-pay | Admitting: Family Medicine

## 2018-06-14 VITALS — BP 176/85 | HR 76 | Temp 98.2°F | Ht 76.0 in | Wt 253.0 lb

## 2018-06-14 DIAGNOSIS — M545 Low back pain, unspecified: Secondary | ICD-10-CM

## 2018-06-14 DIAGNOSIS — E119 Type 2 diabetes mellitus without complications: Secondary | ICD-10-CM | POA: Diagnosis not present

## 2018-06-14 MED ORDER — PREDNISONE 10 MG PO TABS: ORAL_TABLET | ORAL | 0 refills | Status: DC

## 2018-06-14 MED ORDER — SITAGLIPTIN PHOS-METFORMIN HCL 50-1000 MG PO TABS: 1.0000 | ORAL_TABLET | Freq: Two times a day (BID) | ORAL | 2 refills | Status: DC

## 2018-06-14 MED ORDER — CYCLOBENZAPRINE HCL 10 MG PO TABS: 10.0000 mg | ORAL_TABLET | Freq: Three times a day (TID) | ORAL | 1 refills | Status: DC | PRN

## 2018-06-14 MED ORDER — ERTUGLIFLOZIN L-PYROGLUTAMICAC 5 MG PO TABS: 5.0000 mg | ORAL_TABLET | Freq: Every day | ORAL | 2 refills | Status: DC

## 2018-06-14 MED ORDER — GABAPENTIN 300 MG PO CAPS: ORAL_CAPSULE | ORAL | 0 refills | Status: DC

## 2018-06-14 NOTE — Progress Notes (Signed)
Subjective:  Patient ID: Matthew Thompson, male    DOB: 25-Oct-1965  Age: 53 y.o. MRN: [id number]  CC: Annual Exam; Diabetes; and Coronary Artery Disease   HPI Matthew Thompson presents for Follow-up of diabetes. Patient checks blood sugar at home.  Staying 200 -300 Patient denies symptoms such as polyuria, polydipsia, excessive hunger, nausea No significant hypoglycemic spells noted. Medications reviewed. Pt reports taking them regularly without complication/adverse reaction being reported today.  Recently educated with nutritionist. 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 He is unable to eat properly because the truck stops have closed their restaurants due to COVID. Staying at home as a result.   Low back pain. NKI. Moderately severe. Goes from R buttocks to ankle, along posterior thigh and leg.   History Matthew Thompson has a past medical history of Diabetes mellitus without complication (HCC) and Hypertension.   He has a past surgical history that includes left hand and Hernia repair.   His family history includes Cancer in his father and mother; Diabetes in his brother, father, mother, and sister; Heart disease in his mother; Hypertension in his brother, father, mother, and sister; Stroke in his mother.He reports that he has been smoking cigarettes. He has a 10.50 pack-year smoking history. He has never used smokeless tobacco. He reports current alcohol use. He reports that he does not use drugs.  Current Outpatient Medications on File Prior to Visit  Medication Sig Dispense Refill  . SitaGLIPtin-MetFORMIN HCl [phone] MG TB24 Take 1 tablet by mouth daily. 90 tablet 1   No current facility-administered medications on file prior to visit.     ROS Review of Systems  Constitutional: Negative for fever.  Respiratory: Negative for shortness of breath.   Cardiovascular: Negative for chest pain.  Musculoskeletal:  Positive for arthralgias, back pain and myalgias.  Skin: Negative for rash.    Objective:  BP (!) 176/85   Pulse 76   Temp 98.2 F (36.8 C) (Oral)   Ht 6\' 4"  (1.93 m)   Wt 253 lb (114.8 kg)   BMI 30.80 kg/m   BP Readings from Last 3 Encounters:  06/14/18 (!) 176/85  04/13/18 140/78  08/10/16 (!) 162/77    Wt Readings from Last 3 Encounters:  06/14/18 253 lb (114.8 kg)  04/13/18 244 lb 8 oz (110.9 kg)  08/10/16 256 lb (116.1 kg)     Physical Exam Vitals signs reviewed.  Constitutional:      Appearance: He is well-developed.  HENT:     Head: Normocephalic and atraumatic.     Right Ear: Tympanic membrane and external ear normal. No decreased hearing noted.     Left Ear: Tympanic membrane and external ear normal. No decreased hearing noted.     Mouth/Throat:     Pharynx: No oropharyngeal exudate or posterior oropharyngeal erythema.  Eyes:     Pupils: Pupils are equal, round, and reactive to light.  Neck:     Musculoskeletal: Normal range of motion and neck supple.  Cardiovascular:     Rate and Rhythm: Normal rate and regular rhythm.     Heart sounds: No murmur.  Pulmonary:     Effort: No respiratory distress.     Breath sounds: Normal breath sounds.  Abdominal:     General: Bowel sounds are normal.     Palpations: Abdomen is soft. There is no mass.     Tenderness: There is no abdominal  tenderness.  Musculoskeletal:        General: Tenderness (right lumbar paraspinal spasm noted at L3-5 region) present. No swelling, deformity or signs of injury.     Right lower leg: No edema.     Left lower leg: No edema.       Assessment & Plan:   Matthew Thompson was seen today for annual exam, diabetes and coronary artery disease.  Diagnoses and all orders for this visit:  Diabetes mellitus, new onset (HCC)  Lumbar pain  Other orders -     sitaGLIPtin-metformin (JANUMET) 50-1000 MG tablet; Take 1 tablet by mouth 2 (two) times daily with a meal. -     predniSONE (DELTASONE)  10 MG tablet; Take 5 daily for 2 days followed by 4,3,2 and 1 for 2 days each. -     cyclobenzaprine (FLEXERIL) 10 MG tablet; Take 1 tablet (10 mg total) by mouth 3 (three) times daily as needed for muscle spasms. -     gabapentin (NEURONTIN) 300 MG capsule; 1 at bedtime for1 week then 2  The next  week then 3 the next week then 4 daily. -     Ertugliflozin L-PyroglutamicAc (STEGLATRO) 5 MG TABS; Take 5 mg by mouth daily.      I have discontinued Matthew Thompson's amoxicillin-clavulanate. I am also having him start on sitaGLIPtin-metformin, predniSONE, cyclobenzaprine, gabapentin, and Ertugliflozin L-PyroglutamicAc. Additionally, I am having him maintain his SitaGLIPtin-MetFORMIN HCl.  Meds ordered this encounter  Medications  . sitaGLIPtin-metformin (JANUMET) 50-1000 MG tablet    Sig: Take 1 tablet by mouth 2 (two) times daily with a meal.    Dispense:  60 tablet    Refill:  2  . predniSONE (DELTASONE) 10 MG tablet    Sig: Take 5 daily for 2 days followed by 4,3,2 and 1 for 2 days each.    Dispense:  30 tablet    Refill:  0  . cyclobenzaprine (FLEXERIL) 10 MG tablet    Sig: Take 1 tablet (10 mg total) by mouth 3 (three) times daily as needed for muscle spasms.    Dispense:  90 tablet    Refill:  1  . gabapentin (NEURONTIN) 300 MG capsule    Sig: 1 at bedtime for1 week then 2  The next  week then 3 the next week then 4 daily.    Dispense:  120 capsule    Refill:  0  . Ertugliflozin L-PyroglutamicAc (STEGLATRO) 5 MG TABS    Sig: Take 5 mg by mouth daily.    Dispense:  30 tablet    Refill:  2     Follow-up: Return in about 1 month (around 07/15/2018).  Mechele Claude, M.D.

## 2018-07-14 ENCOUNTER — Other Ambulatory Visit: Payer: Self-pay

## 2018-07-15 ENCOUNTER — Encounter: Payer: Self-pay | Admitting: Family Medicine

## 2018-07-15 ENCOUNTER — Ambulatory Visit (INDEPENDENT_AMBULATORY_CARE_PROVIDER_SITE_OTHER): Payer: PRIVATE HEALTH INSURANCE | Admitting: Family Medicine

## 2018-07-15 ENCOUNTER — Encounter: Payer: Self-pay | Admitting: *Deleted

## 2018-07-15 VITALS — BP 151/85 | HR 84 | Temp 97.9°F | Ht 76.0 in | Wt 251.0 lb

## 2018-07-15 DIAGNOSIS — E1165 Type 2 diabetes mellitus with hyperglycemia: Secondary | ICD-10-CM | POA: Diagnosis not present

## 2018-07-15 DIAGNOSIS — G5601 Carpal tunnel syndrome, right upper limb: Secondary | ICD-10-CM | POA: Diagnosis not present

## 2018-07-15 DIAGNOSIS — M545 Low back pain, unspecified: Secondary | ICD-10-CM

## 2018-07-15 MED ORDER — GLIPIZIDE 5 MG PO TABS: 5.0000 mg | ORAL_TABLET | Freq: Two times a day (BID) | ORAL | 3 refills | Status: DC

## 2018-07-15 MED ORDER — DICLOFENAC SODIUM 75 MG PO TBEC: 75.0000 mg | DELAYED_RELEASE_TABLET | Freq: Two times a day (BID) | ORAL | 2 refills | Status: DC

## 2018-07-15 MED ORDER — PIOGLITAZONE HCL 30 MG PO TABS: 30.0000 mg | ORAL_TABLET | Freq: Every day | ORAL | 2 refills | Status: DC

## 2018-07-15 MED ORDER — CYCLOBENZAPRINE HCL 10 MG PO TABS: 10.0000 mg | ORAL_TABLET | Freq: Three times a day (TID) | ORAL | 1 refills | Status: DC | PRN

## 2018-07-15 NOTE — Addendum Note (Signed)
Addended by: Zannie Cove on: 07/15/2018 09:25 AM   Modules accepted: Orders

## 2018-07-15 NOTE — Progress Notes (Signed)
Subjective:  Patient ID: Matthew LeashCalvin L Maiolo, male    DOB: 1965-12-21  Age: 53 y.o. MRN: [id number]  CC: Diabetes (1 mo (higher on Pred) )   HPI Matthew Leashalvin L Thompson presents forFollow-up of diabetes. Patient checks blood sugar at home.  Running 400-500. Back ed off on prednisone to lower amount per day. Still has a few.  Patient denies symptoms such as polyuria, polydipsia, excessive hunger, nausea No significant hypoglycemic spells noted. Medications reviewed. Pt reports taking them regularly without complication/adverse reaction being reported today.  Checking feet daily.  History Jerilynn SomCalvin has a past medical history of Diabetes mellitus without complication (HCC) and Hypertension.   He has a past surgical history that includes left hand and Hernia repair.   His family history includes Cancer in his father and mother; Diabetes in his brother, father, mother, and sister; Heart disease in his mother; Hypertension in his brother, father, mother, and sister; Stroke in his mother.He reports that he has been smoking cigarettes. He has a 10.50 pack-year smoking history. He has never used smokeless tobacco. He reports current alcohol use. He reports that he does not use drugs.  Current Outpatient Medications on File Prior to Visit  Medication Sig Dispense Refill  . cyclobenzaprine (FLEXERIL) 10 MG tablet Take 1 tablet (10 mg total) by mouth 3 (three) times daily as needed for muscle spasms. 90 tablet 1  . Ertugliflozin L-PyroglutamicAc (STEGLATRO) 5 MG TABS Take 5 mg by mouth daily. 30 tablet 2  . gabapentin (NEURONTIN) 300 MG capsule 1 at bedtime for1 week then 2  The next  week then 3 the next week then 4 daily. 120 capsule 0  . predniSONE (DELTASONE) 10 MG tablet Take 5 daily for 2 days followed by 4,3,2 and 1 for 2 days each. 30 tablet 0  . sitaGLIPtin-metformin (JANUMET) 50-1000 MG tablet Take 1 tablet by mouth 2 (two) times daily with a meal. 60 tablet 2   No current facility-administered  medications on file prior to visit.     ROS Review of Systems  Constitutional: Negative.   HENT: Negative.   Eyes: Negative for visual disturbance.  Respiratory: Negative for cough and shortness of breath.   Cardiovascular: Negative for chest pain and leg swelling.  Gastrointestinal: Negative for abdominal pain, diarrhea, nausea and vomiting.  Genitourinary: Negative for difficulty urinating.  Musculoskeletal: Positive for back pain (intermittent. Severe at times. Lumbar. No radiation. PRevious work up with MRI, shots [facility]. Especially severe when he gets out of bed at night to go to the restroom.). Negative for arthralgias and myalgias.  Skin: Negative for rash.  Neurological: Positive for numbness (right fingers 4&5. Awakens with them numb 2-3 times a month. Lasts about 4 hours.). Negative for headaches.  Psychiatric/Behavioral: Negative for sleep disturbance.    Objective:  BP (!) 151/85   Pulse 84   Temp 97.9 F (36.6 C) (Oral)   Ht 6\' 4"  (1.93 m)   Wt 251 lb (113.9 kg)   BMI 30.55 kg/m   BP Readings from Last 3 Encounters:  07/15/18 (!) 151/85  06/14/18 (!) 176/85  04/13/18 140/78    Wt Readings from Last 3 Encounters:  07/15/18 251 lb (113.9 kg)  06/14/18 253 lb (114.8 kg)  04/13/18 244 lb 8 oz (110.9 kg)     Physical Exam Vitals signs reviewed.  Constitutional:      Appearance: He is well-developed.  HENT:     Head: Normocephalic and atraumatic.  Right Ear: External ear normal.     Left Ear: External ear normal.     Mouth/Throat:     Pharynx: No oropharyngeal exudate or posterior oropharyngeal erythema.  Eyes:     Pupils: Pupils are equal, round, and reactive to light.  Neck:     Musculoskeletal: Normal range of motion and neck supple.  Cardiovascular:     Rate and Rhythm: Normal rate and regular rhythm.     Heart sounds: No murmur.  Pulmonary:     Effort: No respiratory distress.     Breath sounds: Normal breath sounds.   Musculoskeletal: Normal range of motion.        General: No swelling, tenderness or deformity.     Comments: 5/5 strength RUE. NV intact. Pulses nml. Neg. Tinel sign.   Neurological:     Mental Status: He is alert and oriented to person, place, and time.       Assessment & Plan:   There are no diagnoses linked to this encounter.    I have discontinued Clive L. Skilling's SitaGLIPtin-MetFORMIN HCl. I am also having him maintain his sitaGLIPtin-metformin, predniSONE, cyclobenzaprine, gabapentin, and Ertugliflozin L-PyroglutamicAc.  No orders of the defined types were placed in this encounter.    Follow-up: No follow-ups on file.  Claretta Fraise, M.D.

## 2018-08-15 ENCOUNTER — Other Ambulatory Visit: Payer: Self-pay

## 2018-08-16 ENCOUNTER — Encounter: Payer: Self-pay | Admitting: Family Medicine

## 2018-08-16 ENCOUNTER — Telehealth: Payer: Self-pay

## 2018-08-16 ENCOUNTER — Ambulatory Visit (INDEPENDENT_AMBULATORY_CARE_PROVIDER_SITE_OTHER): Payer: PRIVATE HEALTH INSURANCE | Admitting: Family Medicine

## 2018-08-16 VITALS — BP 140/81 | HR 88 | Temp 97.6°F | Ht 76.0 in | Wt 248.2 lb

## 2018-08-16 DIAGNOSIS — E1165 Type 2 diabetes mellitus with hyperglycemia: Secondary | ICD-10-CM

## 2018-08-16 DIAGNOSIS — J410 Simple chronic bronchitis: Secondary | ICD-10-CM

## 2018-08-16 DIAGNOSIS — F17200 Nicotine dependence, unspecified, uncomplicated: Secondary | ICD-10-CM | POA: Diagnosis not present

## 2018-08-16 MED ORDER — CHANTIX STARTING MONTH PAK 0.5 MG X 11 & 1 MG X 42 PO TABS: ORAL_TABLET | ORAL | 0 refills | Status: DC

## 2018-08-16 MED ORDER — GABAPENTIN 300 MG PO CAPS: ORAL_CAPSULE | ORAL | 0 refills | Status: DC

## 2018-08-16 MED ORDER — GLIPIZIDE 5 MG PO TABS: 5.0000 mg | ORAL_TABLET | Freq: Two times a day (BID) | ORAL | 3 refills | Status: DC

## 2018-08-16 MED ORDER — VARENICLINE TARTRATE 1 MG PO TABS: 1.0000 mg | ORAL_TABLET | Freq: Two times a day (BID) | ORAL | 5 refills | Status: DC

## 2018-08-16 MED ORDER — PIOGLITAZONE HCL 30 MG PO TABS: 30.0000 mg | ORAL_TABLET | Freq: Every day | ORAL | 2 refills | Status: DC

## 2018-08-16 MED ORDER — PIOGLITAZONE HCL 45 MG PO TABS: 45.0000 mg | ORAL_TABLET | Freq: Every day | ORAL | 1 refills | Status: DC

## 2018-08-16 MED ORDER — GABAPENTIN 300 MG PO CAPS: ORAL_CAPSULE | ORAL | 1 refills | Status: DC

## 2018-08-16 MED ORDER — JANUMET 50-1000 MG PO TABS: 1.0000 | ORAL_TABLET | Freq: Two times a day (BID) | ORAL | 2 refills | Status: DC

## 2018-08-16 NOTE — Telephone Encounter (Signed)
What does of gabapentin is patient supposed to be on ? Pharmacy states they received two different rx's.

## 2018-08-16 NOTE — Progress Notes (Signed)
Subjective:  Patient ID: Matthew Thompson, male    DOB: [birth year]  Age: 53 y.o. MRN: [id number]  CC: Diabetes (1 month follow up- Patient states BS has been running between 190-227.)   HPI Matthew Thompson presents forFollow-up of diabetes. Patient checks blood sugar at home.   180 fasting and 220 postprandial Patient denies symptoms such as polyuria, polydipsia, excessive hunger, nausea No significant hypoglycemic spells noted. Medications reviewed. Pt reports taking them regularly without complication/adverse reaction being reported today.  Checking feet daily.  History Matthew Thompson has a past medical history of Diabetes mellitus without complication ([place]) and Hypertension.   He has a past surgical history that includes left hand and Hernia repair.   His family history includes Cancer in his father and mother; Diabetes in his brother, father, mother, and sister; Heart disease in his mother; Hypertension in his brother, father, mother, and sister; Stroke in his mother.He reports that he has been smoking cigarettes. He has a 10.50 pack-year smoking history. He has never used smokeless tobacco. He reports current alcohol use. He reports that he does not use drugs.  Current Outpatient Medications on File Prior to Visit  Medication Sig Dispense Refill  . Ertugliflozin L-PyroglutamicAc (STEGLATRO) 5 MG TABS Take 5 mg by mouth daily. 30 tablet 2   No current facility-administered medications on file prior to visit.     ROS Review of Systems  Constitutional: Negative for fever.  Respiratory: Positive for cough (chronic, smokers cough). Negative for shortness of breath.   Cardiovascular: Negative for chest pain.  Gastrointestinal: Negative for abdominal pain.  Musculoskeletal: Negative for arthralgias.  Skin: Negative for rash.    Objective:  BP 140/81   Pulse 88   Temp 97.6 F (36.4 C) (Oral)   Ht 6' 4"  (1.93 m)   Wt 248 lb 3.2 oz (112.6 kg)   BMI 30.21 kg/m   BP Readings from  Last 3 Encounters:  08/16/18 140/81  07/15/18 (!) 151/85  06/14/18 (!) 176/85    Wt Readings from Last 3 Encounters:  08/16/18 248 lb 3.2 oz (112.6 kg)  07/15/18 251 lb (113.9 kg)  06/14/18 253 lb (114.8 kg)     Physical Exam Vitals signs reviewed.  Constitutional:      Appearance: He is well-developed.  HENT:     Head: Normocephalic and atraumatic.     Right Ear: External ear normal.     Left Ear: External ear normal.     Mouth/Throat:     Pharynx: No oropharyngeal exudate or posterior oropharyngeal erythema.  Eyes:     Pupils: Pupils are equal, round, and reactive to light.  Neck:     Musculoskeletal: Normal range of motion and neck supple.  Cardiovascular:     Rate and Rhythm: Normal rate and regular rhythm.     Heart sounds: No murmur.  Pulmonary:     Effort: No respiratory distress.     Breath sounds: Normal breath sounds.  Neurological:     Mental Status: He is alert and oriented to person, place, and time.       Assessment & Plan:   Matthew Thompson was seen today for diabetes.  Diagnoses and all orders for this visit:  Type 2 diabetes mellitus with hyperglycemia, without long-term current use of insulin (HCC) -     CMP14+EGFR -     Lipid panel -     Bayer DCA Hb A1c Waived  Smoker  Smokers' cough ([place])  Other orders -  glipiZIDE (GLUCOTROL) 5 MG tablet; Take 1 tablet (5 mg total) by mouth 2 (two) times daily before a meal. -     Discontinue: gabapentin (NEURONTIN) 300 MG capsule; 1 at bedtime for1 week then 2  The next  week then 3 the next week then 4 daily. -     sitaGLIPtin-metformin (JANUMET) 50-1000 MG tablet; Take 1 tablet by mouth 2 (two) times daily with a meal. -     Discontinue: pioglitazone (ACTOS) 30 MG tablet; Take 1 tablet (30 mg total) by mouth daily. -     gabapentin (NEURONTIN) 300 MG capsule; Take two each morning and 4 each evening -     pioglitazone (ACTOS) 45 MG tablet; Take 1 tablet (45 mg total) by mouth daily. -     varenicline  (CHANTIX CONTINUING MONTH PAK) 1 MG tablet; Take 1 tablet (1 mg total) by mouth 2 (two) times daily. -     varenicline (CHANTIX STARTING MONTH PAK) 0.5 MG X 11 & 1 MG X 42 tablet; Use according to package directions      I have discontinued Matthew Thompson's predniSONE, gabapentin, diclofenac, pioglitazone, and cyclobenzaprine. I have also changed his gabapentin and pioglitazone. Additionally, I am having him start on varenicline and Chantix Starting Month Pak. Lastly, I am having him maintain his Ertugliflozin L-PyroglutamicAc, glipiZIDE, and Janumet.  Meds ordered this encounter  Medications  . glipiZIDE (GLUCOTROL) 5 MG tablet    Sig: Take 1 tablet (5 mg total) by mouth 2 (two) times daily before a meal.    Dispense:  60 tablet    Refill:  3  . DISCONTD: gabapentin (NEURONTIN) 300 MG capsule    Sig: 1 at bedtime for1 week then 2  The next  week then 3 the next week then 4 daily.    Dispense:  120 capsule    Refill:  0  . sitaGLIPtin-metformin (JANUMET) 50-1000 MG tablet    Sig: Take 1 tablet by mouth 2 (two) times daily with a meal.    Dispense:  60 tablet    Refill:  2  . DISCONTD: pioglitazone (ACTOS) 30 MG tablet    Sig: Take 1 tablet (30 mg total) by mouth daily.    Dispense:  30 tablet    Refill:  2  . gabapentin (NEURONTIN) 300 MG capsule    Sig: Take two each morning and 4 each evening    Dispense:  180 capsule    Refill:  1  . pioglitazone (ACTOS) 45 MG tablet    Sig: Take 1 tablet (45 mg total) by mouth daily.    Dispense:  90 tablet    Refill:  1  . varenicline (CHANTIX CONTINUING MONTH PAK) 1 MG tablet    Sig: Take 1 tablet (1 mg total) by mouth 2 (two) times daily.    Dispense:  60 tablet    Refill:  5    Fill after pt. Finishes starter please  . varenicline (CHANTIX STARTING MONTH PAK) 0.5 MG X 11 & 1 MG X 42 tablet    Sig: Use according to package directions    Dispense:  53 tablet    Refill:  0     Follow-up: Return in about 6 weeks (around 09/27/2018).   Matthew Thompson, M.D.

## 2018-08-16 NOTE — Telephone Encounter (Signed)
Pharmacist Cyril Mourning) aware of gabapentin dosage

## 2018-08-16 NOTE — Telephone Encounter (Signed)
I increased it to 300 mg tabs, 2 AM & 4 hs

## 2018-08-22 ENCOUNTER — Encounter: Payer: Self-pay | Admitting: Family

## 2018-08-22 ENCOUNTER — Ambulatory Visit (INDEPENDENT_AMBULATORY_CARE_PROVIDER_SITE_OTHER): Payer: PRIVATE HEALTH INSURANCE | Admitting: Family

## 2018-08-22 ENCOUNTER — Other Ambulatory Visit: Payer: Self-pay

## 2018-08-22 DIAGNOSIS — J301 Allergic rhinitis due to pollen: Secondary | ICD-10-CM

## 2018-08-22 MED ORDER — CETIRIZINE HCL 10 MG PO TABS: 10.0000 mg | ORAL_TABLET | Freq: Every day | ORAL | 11 refills | Status: DC

## 2018-08-22 MED ORDER — FLUTICASONE PROPIONATE 50 MCG/ACT NA SUSP: 2.0000 | Freq: Every day | NASAL | 6 refills | Status: DC

## 2018-08-22 NOTE — Progress Notes (Signed)
   Virtual Visit via telephone Note  I connected with Matthew Thompson on 08/22/18 at 1:39 pm by telephone and verified that I am speaking with the correct person using two identifiers. Matthew Thompson is currently located at home and no one  is currently with her during visit. The provider, Evelina Dun, FNP is located in their office at time of visit.  I discussed the limitations, risks, security and privacy concerns of performing an evaluation and management service by telephone and the availability of in person appointments. I also discussed with the patient that there may be a patient responsible charge related to this service. The patient expressed understanding and agreed to proceed.   History and Present Illness:  Sinusitis This is a recurrent problem. The current episode started in the past 7 days. The problem has been gradually worsening since onset. There has been no fever. His pain is at a severity of 3/10. The pain is mild. Associated symptoms include congestion, coughing, sinus pressure (tightness) and sneezing. Pertinent negatives include no chills, ear pain, headaches, hoarse voice or sore throat. Past treatments include nothing. The treatment provided no relief.      Review of Systems  Constitutional: Negative for chills.  HENT: Positive for congestion, sinus pressure (tightness) and sneezing. Negative for ear pain, hoarse voice and sore throat.   Respiratory: Positive for cough.   Neurological: Negative for headaches.  All other systems reviewed and are negative.    Observations/Objective: No SOB or distress noted   Assessment and Plan: 1. Allergic rhinitis due to pollen, unspecified seasonality Start daily zyrtec every day. States he gets like this every Summer. Discussed that he needs to start taking Zyrtec in Spring and continue into fall  Avoid allergens when possible Force fluids Call office if symptoms worsen or do not improve  - cetirizine (ZYRTEC) 10 MG  tablet; Take 1 tablet (10 mg total) by mouth daily.  Dispense: 30 tablet; Refill: 11 - fluticasone (FLONASE) 50 MCG/ACT nasal spray; Place 2 sprays into both nostrils daily.  Dispense: 16 g; Refill: 6      I discussed the assessment and treatment plan with the patient. The patient was provided an opportunity to ask questions and all were answered. The patient agreed with the plan and demonstrated an understanding of the instructions.   The patient was advised to call back or seek an in-person evaluation if the symptoms worsen or if the condition fails to improve as anticipated.  The above assessment and management plan was discussed with the patient. The patient verbalized understanding of and has agreed to the management plan. Patient is aware to call the clinic if symptoms persist or worsen. Patient is aware when to return to the clinic for a follow-up visit. Patient educated on when it is appropriate to go to the emergency department.   Time call ended:   1:45 pm  I provided 6 minutes of non-face-to-face time during this encounter.    Evelina Dun, FNP

## 2018-09-12 ENCOUNTER — Ambulatory Visit: Payer: PRIVATE HEALTH INSURANCE | Admitting: Family Medicine

## 2018-10-03 ENCOUNTER — Other Ambulatory Visit: Payer: Self-pay | Admitting: Family Medicine

## 2018-10-03 ENCOUNTER — Telehealth: Payer: Self-pay | Admitting: Family Medicine

## 2018-10-03 ENCOUNTER — Encounter: Payer: Self-pay | Admitting: Family Medicine

## 2018-10-03 ENCOUNTER — Ambulatory Visit (INDEPENDENT_AMBULATORY_CARE_PROVIDER_SITE_OTHER): Payer: PRIVATE HEALTH INSURANCE | Admitting: Family Medicine

## 2018-10-03 DIAGNOSIS — E1165 Type 2 diabetes mellitus with hyperglycemia: Secondary | ICD-10-CM | POA: Diagnosis not present

## 2018-10-03 MED ORDER — AMOXICILLIN-POT CLAVULANATE 875-125 MG PO TABS
1.0000 | ORAL_TABLET | Freq: Two times a day (BID) | ORAL | 0 refills | Status: DC
Start: 2018-10-03 — End: 2019-02-24

## 2018-10-03 MED ORDER — TRULICITY 0.75 MG/0.5ML ~~LOC~~ SOAJ: 0.7500 mg | SUBCUTANEOUS | 0 refills | Status: DC

## 2018-10-03 MED ORDER — BYDUREON BCISE 2 MG/0.85ML ~~LOC~~ AUIJ: 2.0000 mg | AUTO-INJECTOR | SUBCUTANEOUS | 5 refills | Status: DC
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
## 2018-10-03 NOTE — Telephone Encounter (Signed)
Please contact the patient. I sent in [place]. It is also a once a week injection. It is preferred according to the information I have. Thanks, WS

## 2018-10-03 NOTE — Telephone Encounter (Signed)
Trulicity must have prior authorization.  Do you prefer to change to another medicine?

## 2018-10-04 NOTE — Telephone Encounter (Signed)
Patient aware.

## 2018-10-06 ENCOUNTER — Telehealth: Payer: Self-pay | Admitting: *Deleted

## 2018-10-06 NOTE — Telephone Encounter (Addendum)
Prior Auth for [employer] 2mg /0.60ml-APPROVED  Key: [id number]   Approved for generic BYDUREON BCISE (exenatide Extended Release Suspension Auto-Injector 2 MG/0.85ML) , quantity up to 4 pens per 28 days, under the pharmacy benefit. Generic substitution required when available.     Your request has been successfully sent to [facility] for review. You may close this dialog, return to your dashboard, and perform other tasks.  To check for an update later, open this request again from your dashboard.  Your request will be reviewed within 24 hours. If needed, you may contact [facility] at [phone].

## 2019-02-19 ENCOUNTER — Other Ambulatory Visit: Payer: Self-pay | Admitting: Family Medicine

## 2019-02-23 ENCOUNTER — Telehealth: Payer: Self-pay | Admitting: *Deleted

## 2019-02-23 NOTE — Telephone Encounter (Signed)
Pt has new insurance and the Bydureon isn't covered  Covered alternatives are Ozempic, Rybelsus, Trulicity and Victoza,   Pt knows you are going to send in a different medication and is ok with it.

## 2019-02-24 MED ORDER — TRULICITY 1.5 MG/0.5ML ~~LOC~~ SOAJ: SUBCUTANEOUS | 12 refills | Status: DC

## 2019-03-14 ENCOUNTER — Other Ambulatory Visit: Payer: Self-pay | Admitting: Family Medicine

## 2019-03-20 ENCOUNTER — Other Ambulatory Visit: Payer: Self-pay | Admitting: Family Medicine

## 2019-03-23 ENCOUNTER — Ambulatory Visit: Payer: PRIVATE HEALTH INSURANCE | Admitting: Family Medicine

## 2019-04-12 ENCOUNTER — Other Ambulatory Visit: Payer: Self-pay

## 2019-04-13 ENCOUNTER — Other Ambulatory Visit: Payer: Self-pay

## 2019-04-13 ENCOUNTER — Ambulatory Visit (INDEPENDENT_AMBULATORY_CARE_PROVIDER_SITE_OTHER): Payer: BC Managed Care – PPO | Admitting: Family Medicine

## 2019-04-13 ENCOUNTER — Encounter: Payer: Self-pay | Admitting: Family Medicine

## 2019-04-13 VITALS — BP 144/79 | HR 68 | Temp 99.3°F | Ht 76.0 in | Wt 254.0 lb

## 2019-04-13 DIAGNOSIS — M545 Low back pain, unspecified: Secondary | ICD-10-CM

## 2019-04-13 DIAGNOSIS — E1141 Type 2 diabetes mellitus with diabetic mononeuropathy: Secondary | ICD-10-CM

## 2019-04-13 LAB — BAYER DCA HB A1C WAIVED: HB A1C (BAYER DCA - WAIVED): 7.7 % — ABNORMAL HIGH (ref <7.0)

## 2019-04-13 MED ORDER — GABAPENTIN 300 MG PO CAPS: 1200.0000 mg | ORAL_CAPSULE | Freq: Two times a day (BID) | ORAL | 1 refills | Status: DC

## 2019-04-13 MED ORDER — JANUMET 50-1000 MG PO TABS: 1.0000 | ORAL_TABLET | Freq: Two times a day (BID) | ORAL | 1 refills | Status: DC

## 2019-04-13 NOTE — Patient Instructions (Signed)

## 2019-04-14 ENCOUNTER — Other Ambulatory Visit: Payer: Self-pay | Admitting: Family Medicine

## 2019-04-14 LAB — CMP14+EGFR
ALT: 16 IU/L (ref 0–44)
AST: 16 IU/L (ref 0–40)
Albumin/Globulin Ratio: 1.6 (ref 1.2–2.2)
Albumin: 4.4 g/dL (ref 3.8–4.9)
Alkaline Phosphatase: 114 IU/L (ref 39–117)
BUN/Creatinine Ratio: 9 (ref 9–20)
BUN: 8 mg/dL (ref 6–24)
Bilirubin Total: 0.3 mg/dL (ref 0.0–1.2)
CO2: 20 mmol/L (ref 20–29)
Calcium: 9.4 mg/dL (ref 8.7–10.2)
Chloride: 105 mmol/L (ref 96–106)
Creatinine, Ser: 0.94 mg/dL (ref 0.76–1.27)
GFR calc Af Amer: 108 mL/min/{1.73_m2} (ref >59)
GFR calc non Af Amer: 93 mL/min/{1.73_m2} (ref >59)
Globulin, Total: 2.7 g/dL (ref 1.5–4.5)
Glucose: 137 mg/dL — ABNORMAL HIGH (ref 65–99)
Potassium: 4.2 mmol/L (ref 3.5–5.2)
Sodium: 139 mmol/L (ref 134–144)
Total Protein: 7.1 g/dL (ref 6.0–8.5)

## 2019-04-14 LAB — CBC WITH DIFFERENTIAL/PLATELET
Basophils Absolute: 0 10*3/uL (ref 0.0–0.2)
Basos: 1 %
EOS (ABSOLUTE): 0.1 10*3/uL (ref 0.0–0.4)
Eos: 2 %
Hematocrit: 40.9 % (ref 37.5–51.0)
Hemoglobin: 14.5 g/dL (ref 13.0–17.7)
Immature Grans (Abs): 0 10*3/uL (ref 0.0–0.1)
Immature Granulocytes: 0 %
Lymphocytes Absolute: 2.5 10*3/uL (ref 0.7–3.1)
Lymphs: 38 %
MCH: 32.6 pg (ref 26.6–33.0)
MCHC: 35.5 g/dL (ref 31.5–35.7)
MCV: 92 fL (ref 79–97)
Monocytes Absolute: 1.1 10*3/uL — ABNORMAL HIGH (ref 0.1–0.9)
Monocytes: 16 %
Neutrophils Absolute: 2.8 10*3/uL (ref 1.4–7.0)
Neutrophils: 43 %
Platelets: 290 10*3/uL (ref 150–450)
RBC: 4.45 x10E6/uL (ref 4.14–5.80)
RDW: 12.7 % (ref 11.6–15.4)
WBC: 6.5 10*3/uL (ref 3.4–10.8)

## 2019-04-14 LAB — LIPID PANEL
Chol/HDL Ratio: 5.5 ratio — ABNORMAL HIGH (ref 0.0–5.0)
Cholesterol, Total: 172 mg/dL (ref 100–199)
HDL: 31 mg/dL — ABNORMAL LOW (ref >39)
LDL Chol Calc (NIH): 112 mg/dL — ABNORMAL HIGH (ref 0–99)
Triglycerides: 162 mg/dL — ABNORMAL HIGH (ref 0–149)
VLDL Cholesterol Cal: 29 mg/dL (ref 5–40)

## 2019-04-14 LAB — MICROALBUMIN / CREATININE URINE RATIO
Creatinine, Urine: 165.1 mg/dL
Microalb/Creat Ratio: 22 mg/g creat (ref 0–29)
Microalbumin, Urine: 35.8 ug/mL

## 2019-04-14 MED ORDER — ATORVASTATIN CALCIUM 40 MG PO TABS: 40.0000 mg | ORAL_TABLET | Freq: Every day | ORAL | 3 refills | Status: DC

## 2019-04-14 NOTE — Progress Notes (Addendum)
Subjective:  Patient ID: Matthew Thompson, male    DOB: 06-17-1965  Age: 54 y.o. MRN: [id number]  CC: Follow-up (6 month)   HPI Matthew Thompson presents forFollow-up of diabetes. Patient checks blood sugar at home.   100 fasting and 200 postprandial Patient denies symptoms such as polyuria, polydipsia, excessive hunger, nausea No significant hypoglycemic spells noted. Medications reviewed. Pt reports taking them regularly without complication/adverse reaction being reported today.  Checking feet daily. Last eye appt was recent  History Matthew Thompson has a past medical history of Diabetes mellitus without complication ([place]) and Hypertension.   He has a past surgical history that includes left hand and Hernia repair.   His family history includes Cancer in his father and mother; Diabetes in his brother, father, mother, and sister; Heart disease in his mother; Hypertension in his brother, father, mother, and sister; Stroke in his mother.He reports that he has been smoking cigarettes. He has a 10.50 pack-year smoking history. He has never used smokeless tobacco. He reports current alcohol use. He reports that he does not use drugs.  Current Outpatient Medications on File Prior to Visit  Medication Sig Dispense Refill  . cetirizine (ZYRTEC) 10 MG tablet Take 1 tablet (10 mg total) by mouth daily. 30 tablet 11  . fluticasone (FLONASE) 50 MCG/ACT nasal spray Place 2 sprays into both nostrils daily. 16 g 6   No current facility-administered medications on file prior to visit.    ROS Review of Systems  Constitutional: Negative.   HENT: Negative.   Eyes: Negative for visual disturbance.  Respiratory: Negative for cough and shortness of breath.   Cardiovascular: Negative for chest pain and leg swelling.  Gastrointestinal: Negative for abdominal pain, diarrhea, nausea and vomiting.  Genitourinary: Negative for difficulty urinating.  Musculoskeletal: Negative for arthralgias and myalgias.  Skin:  Negative for rash.  Neurological: Positive for numbness (RLE - medial calf area, with pain). Negative for headaches.  Psychiatric/Behavioral: Negative for sleep disturbance.    Objective:  BP (!) 144/79   Pulse 68   Temp 99.3 F (37.4 C) (Temporal)   Ht 6' 4"  (1.93 m)   Wt 254 lb (115.2 kg)   BMI 30.92 kg/m   BP Readings from Last 3 Encounters:  04/13/19 (!) 144/79  08/16/18 140/81  07/15/18 (!) 151/85    Wt Readings from Last 3 Encounters:  04/13/19 254 lb (115.2 kg)  08/16/18 248 lb 3.2 oz (112.6 kg)  07/15/18 251 lb (113.9 kg)     Physical Exam Constitutional:      General: He is not in acute distress.    Appearance: He is well-developed.  HENT:     Head: Normocephalic and atraumatic.     Right Ear: External ear normal.     Left Ear: External ear normal.     Nose: Nose normal.  Eyes:     Conjunctiva/sclera: Conjunctivae normal.     Pupils: Pupils are equal, round, and reactive to light.  Cardiovascular:     Rate and Rhythm: Normal rate and regular rhythm.     Heart sounds: Normal heart sounds. No murmur.  Pulmonary:     Effort: Pulmonary effort is normal. No respiratory distress.     Breath sounds: Normal breath sounds. No wheezing or rales.  Abdominal:     Palpations: Abdomen is soft.     Tenderness: There is no abdominal tenderness.  Musculoskeletal:        General: Normal range of motion.     Cervical back:  Normal range of motion and neck supple.  Skin:    General: Skin is warm and dry.  Neurological:     Mental Status: He is alert and oriented to person, place, and time.     Deep Tendon Reflexes: Reflexes are normal and symmetric.  Psychiatric:        Behavior: Behavior normal.        Thought Content: Thought content normal.        Judgment: Judgment normal.       Assessment & Plan:   [place] was seen today for follow-up.  Diagnoses and all orders for this visit:  Type 2 diabetes mellitus with diabetic mononeuropathy, without long-term  current use of insulin (HCC) -     CBC with Differential/Platelet -     CMP14+EGFR -     Lipid panel -     Bayer DCA Hb A1c Waived -     Microalbumin / creatinine urine ratio  Lumbar pain  Other orders -     gabapentin (NEURONTIN) 300 MG capsule; Take 4 capsules (1,200 mg total) by mouth 2 (two) times daily. -     JANUMET 50-1000 MG tablet; Take 1 tablet by mouth 2 (two) times daily.      I have discontinued Tyreak L. Gallardo's Ertugliflozin L-PyroglutamicAc, glipiZIDE, varenicline, Trulicity, and pioglitazone. I have also changed his gabapentin. Additionally, I am having him maintain his cetirizine, fluticasone, and Janumet.  Meds ordered this encounter  Medications  . gabapentin (NEURONTIN) 300 MG capsule    Sig: Take 4 capsules (1,200 mg total) by mouth 2 (two) times daily.    Dispense:  720 capsule    Refill:  1  . JANUMET 50-1000 MG tablet    Sig: Take 1 tablet by mouth 2 (two) times daily.    Dispense:  90 tablet    Refill:  1     Follow-up: Return in about 3 months (around 07/14/2019).  Claretta Fraise, M.D.

## 2019-04-18 ENCOUNTER — Ambulatory Visit: Payer: PRIVATE HEALTH INSURANCE | Admitting: Family Medicine

## 2019-07-18 ENCOUNTER — Ambulatory Visit: Payer: BC Managed Care – PPO | Admitting: Family Medicine

## 2019-07-19 ENCOUNTER — Encounter: Payer: Self-pay | Admitting: Family Medicine

## 2019-07-26 ENCOUNTER — Ambulatory Visit (INDEPENDENT_AMBULATORY_CARE_PROVIDER_SITE_OTHER): Payer: BC Managed Care – PPO

## 2019-07-26 ENCOUNTER — Ambulatory Visit (INDEPENDENT_AMBULATORY_CARE_PROVIDER_SITE_OTHER): Payer: BC Managed Care – PPO | Admitting: Family Medicine

## 2019-07-26 ENCOUNTER — Encounter: Payer: Self-pay | Admitting: Family Medicine

## 2019-07-26 ENCOUNTER — Other Ambulatory Visit: Payer: Self-pay

## 2019-07-26 VITALS — BP 137/82 | HR 84 | Temp 98.2°F | Resp 20 | Ht 76.0 in | Wt 251.0 lb

## 2019-07-26 DIAGNOSIS — M545 Low back pain, unspecified: Secondary | ICD-10-CM

## 2019-07-26 DIAGNOSIS — R200 Anesthesia of skin: Secondary | ICD-10-CM

## 2019-07-26 MED ORDER — PREDNISONE 10 MG PO TABS: ORAL_TABLET | ORAL | 0 refills | Status: DC

## 2019-07-26 MED ORDER — TIZANIDINE HCL 6 MG PO CAPS: 6.0000 mg | ORAL_CAPSULE | Freq: Every day | ORAL | 2 refills | Status: DC

## 2019-07-26 NOTE — Progress Notes (Signed)
Subjective:  Patient ID: Matthew Thompson, male    DOB: [birth year]  Age: 54 y.o. MRN: [id number]  CC: Back Pain   HPI RADLEY BARTO presents for numbness at the medial aspect of the right leg through the calf region.  It goes from the knee to ankle.  He is also been having some back pain.  This can keep him awake at night or at least make it hard to get to sleep.  He states he sleeps okay once he gets to sleep.  The numbness is fairly constant through the day however.  Ongoing for some time.  He tried some leftover prednisone he had at home.  That did seem to give him some relief.  Depression screen [facility] 2/9 07/26/2019 04/13/2019 08/16/2018  Decreased Interest 0 0 0  Down, Depressed, Hopeless 0 0 0  PHQ - 2 Score 0 0 0  Altered sleeping - - -  Tired, decreased energy - - -  Change in appetite - - -  Feeling bad or failure about yourself  - - -  Trouble concentrating - - -  Suicidal thoughts - - -  PHQ-9 Score - - -    History Machai has a past medical history of Diabetes mellitus without complication (HCC) and Hypertension.   He has a past surgical history that includes left hand and Hernia repair.   His family history includes Cancer in his father and mother; Diabetes in his brother, father, mother, and sister; Heart disease in his mother; Hypertension in his brother, father, mother, and sister; Stroke in his mother.He reports that he has been smoking cigarettes. He has a 10.50 pack-year smoking history. He has never used smokeless tobacco. He reports current alcohol use. He reports that he does not use drugs.    ROS Review of Systems  Constitutional: Positive for activity change. Negative for chills, diaphoresis and fever.  Musculoskeletal: Positive for back pain. Negative for gait problem.  Neurological: Positive for numbness. Negative for weakness.    Objective:  BP 137/82   Pulse 84   Temp 98.2 F (36.8 C) (Temporal)   Resp 20   Ht 6\' 4"  (1.93 m)   Wt 251 lb (113.9 kg)    SpO2 97%   BMI 30.55 kg/m   BP Readings from Last 3 Encounters:  07/26/19 137/82  04/13/19 (!) 144/79  08/16/18 140/81    Wt Readings from Last 3 Encounters:  07/26/19 251 lb (113.9 kg)  04/13/19 254 lb (115.2 kg)  08/16/18 248 lb 3.2 oz (112.6 kg)     Physical Exam Vitals reviewed.  Constitutional:      General: He is not in acute distress.    Appearance: He is well-developed.  HENT:     Head: Normocephalic.     Left Ear: Ear canal normal.  Cardiovascular:     Heart sounds: No murmur heard.   Pulmonary:     Effort: Pulmonary effort is normal.  Abdominal:     Tenderness: There is no abdominal tenderness.  Musculoskeletal:        General: Tenderness (RLE) present.     Cervical back: Normal range of motion.     Right lower leg: Edema (mild) present.  Skin:    General: Skin is warm and dry.     Findings: No lesion.  Neurological:     Mental Status: He is alert and oriented to person, place, and time.     Deep Tendon Reflexes: Reflexes are normal and symmetric.  Psychiatric:        Behavior: Behavior normal.        Thought Content: Thought content normal.       Assessment & Plan:   [place]y was seen today for back pain.  Diagnoses and all orders for this visit:  Numbness in right leg -     Ambulatory referral to Neurology -     DG Lumbar Spine 2-3 Views; Future  Lumbar pain -     Ambulatory referral to Neurology -     DG Lumbar Spine 2-3 Views; Future  Other orders -     predniSONE (DELTASONE) 10 MG tablet; Take 5 daily for 3 days followed by 4,3,2 and 1 for 3 days each. -     tizanidine (ZANAFLEX) 6 MG capsule; Take 1 capsule (6 mg total) by mouth at bedtime. To relax muscles       I am having Tyheim L. Dingus start on predniSONE and tizanidine. I am also having him maintain his cetirizine, fluticasone, gabapentin, Janumet, and atorvastatin.  Allergies as of 07/26/2019   No Known Allergies     Medication List       Accurate as of July 26, 2019 12:29 PM. If you have any questions, ask your nurse or doctor.        atorvastatin 40 MG tablet Commonly known as: LIPITOR Take 1 tablet (40 mg total) by mouth daily. For cholesterol   cetirizine 10 MG tablet Commonly known as: ZYRTEC Take 1 tablet (10 mg total) by mouth daily.   fluticasone 50 MCG/ACT nasal spray Commonly known as: FLONASE Place 2 sprays into both nostrils daily.   gabapentin 300 MG capsule Commonly known as: NEURONTIN Take 4 capsules (1,200 mg total) by mouth 2 (two) times daily.   Janumet 50-1000 MG tablet Generic drug: sitaGLIPtin-metformin Take 1 tablet by mouth 2 (two) times daily.   predniSONE 10 MG tablet Commonly known as: DELTASONE Take 5 daily for 3 days followed by 4,3,2 and 1 for 3 days each. Started by: Claretta Fraise, MD   tizanidine 6 MG capsule Commonly known as: ZANAFLEX Take 1 capsule (6 mg total) by mouth at bedtime. To relax muscles Started by: Claretta Fraise, MD        Follow-up: Return in about 1 month (around 08/25/2019).  Claretta Fraise, M.D.

## 2019-08-10 ENCOUNTER — Ambulatory Visit: Payer: BC Managed Care – PPO | Admitting: Family Medicine

## 2019-08-10 ENCOUNTER — Encounter: Payer: Self-pay | Admitting: Family Medicine

## 2019-08-10 ENCOUNTER — Other Ambulatory Visit: Payer: Self-pay

## 2019-08-10 VITALS — BP 157/93 | HR 74 | Temp 98.1°F | Resp 20 | Ht 76.0 in | Wt 247.4 lb

## 2019-08-10 DIAGNOSIS — E782 Mixed hyperlipidemia: Secondary | ICD-10-CM

## 2019-08-10 DIAGNOSIS — E1141 Type 2 diabetes mellitus with diabetic mononeuropathy: Secondary | ICD-10-CM | POA: Diagnosis not present

## 2019-08-10 DIAGNOSIS — M545 Low back pain, unspecified: Secondary | ICD-10-CM

## 2019-08-10 DIAGNOSIS — R200 Anesthesia of skin: Secondary | ICD-10-CM | POA: Diagnosis not present

## 2019-08-10 DIAGNOSIS — N529 Male erectile dysfunction, unspecified: Secondary | ICD-10-CM

## 2019-08-10 DIAGNOSIS — Z1159 Encounter for screening for other viral diseases: Secondary | ICD-10-CM

## 2019-08-10 DIAGNOSIS — Z114 Encounter for screening for human immunodeficiency virus [HIV]: Secondary | ICD-10-CM

## 2019-08-10 LAB — BAYER DCA HB A1C WAIVED: HB A1C (BAYER DCA - WAIVED): 9.2 % — ABNORMAL HIGH (ref <7.0)

## 2019-08-10 MED ORDER — TADALAFIL 5 MG PO TABS: 5.0000 mg | ORAL_TABLET | Freq: Every day | ORAL | 1 refills | Status: DC

## 2019-08-10 MED ORDER — GABAPENTIN 300 MG PO CAPS: 1500.0000 mg | ORAL_CAPSULE | Freq: Two times a day (BID) | ORAL | 1 refills | Status: DC

## 2019-08-10 MED ORDER — TADALAFIL 5 MG PO TABS
5.0000 mg | ORAL_TABLET | Freq: Every day | ORAL | 0 refills | Status: DC | PRN
Start: 2019-08-10 — End: 2019-08-10

## 2019-08-10 NOTE — Progress Notes (Signed)
Subjective:  Patient ID: Matthew Thompson, male    DOB: [birth year]  Age: 54 y.o. MRN: [id number]  CC: Follow up back pain, numbness in right leg   HPI Iona Beard presents forFollow-up of diabetes. Pt. Off diet. Activity level unchanged. Patient denies symptoms such as polyuria, polydipsia, excessive hunger, nausea No significant hypoglycemic spells noted. Medications reviewed. Pt reports taking them regularly without complication/adverse reaction being reported today.   Pt. Reports nausea with lipitor. Taking it with coffee in the morning.  History Barnabas has a past medical history of Diabetes mellitus without complication ([place]) and Hypertension.   He has a past surgical history that includes left hand and Hernia repair.   His family history includes Cancer in his father and mother; Diabetes in his brother, father, mother, and sister; Heart disease in his mother; Hypertension in his brother, father, mother, and sister; Stroke in his mother.He reports that he has been smoking cigarettes. He has a 10.50 pack-year smoking history. He has never used smokeless tobacco. He reports current alcohol use. He reports that he does not use drugs.  Current Outpatient Medications on File Prior to Visit  Medication Sig Dispense Refill  . atorvastatin (LIPITOR) 40 MG tablet Take 1 tablet (40 mg total) by mouth daily. For cholesterol 90 tablet 3  . cetirizine (ZYRTEC) 10 MG tablet Take 1 tablet (10 mg total) by mouth daily. 30 tablet 11  . fluticasone (FLONASE) 50 MCG/ACT nasal spray Place 2 sprays into both nostrils daily. 16 g 6  . JANUMET 50-1000 MG tablet Take 1 tablet by mouth 2 (two) times daily. 90 tablet 1  . predniSONE (DELTASONE) 10 MG tablet Take 5 daily for 3 days followed by 4,3,2 and 1 for 3 days each. 45 tablet 0  . tizanidine (ZANAFLEX) 6 MG capsule Take 1 capsule (6 mg total) by mouth at bedtime. To relax muscles (Patient not taking: Reported on 08/10/2019) 30 capsule 2   No current  facility-administered medications on file prior to visit.    ROS Review of Systems  Constitutional: Negative for fever.  Respiratory: Negative for shortness of breath.   Cardiovascular: Negative for chest pain.  Genitourinary: Negative for penile pain and testicular pain.  Musculoskeletal: Positive for arthralgias and back pain.  Skin: Negative for rash.  Neurological: Positive for numbness (right medial leg).    Objective:  BP (!) 157/93   Pulse 74   Temp 98.1 F (36.7 C) (Temporal)   Resp 20   Ht _0  (1.93 m)   Wt 247 lb 6 oz (112.2 kg)   SpO2 98%   BMI 30.11 kg/m   BP Readings from Last 3 Encounters:  08/10/19 (!) 157/93  07/26/19 137/82  04/13/19 (!) 144/79    Wt Readings from Last 3 Encounters:  08/10/19 247 lb 6 oz (112.2 kg)  07/26/19 251 lb (113.9 kg)  04/13/19 254 lb (115.2 kg)     Physical Exam Vitals reviewed.  Constitutional:      Appearance: He is well-developed.  HENT:     Head: Normocephalic and atraumatic.     Right Ear: Tympanic membrane and external ear normal. No decreased hearing noted.     Left Ear: Tympanic membrane and external ear normal. No decreased hearing noted.     Mouth/Throat:     Pharynx: No oropharyngeal exudate or posterior oropharyngeal erythema.  Eyes:     Pupils: Pupils are equal, round, and reactive to light.  Cardiovascular:     Rate and Rhythm:  Normal rate and regular rhythm.     Heart sounds: No murmur heard.   Pulmonary:     Effort: No respiratory distress.     Breath sounds: Normal breath sounds.  Abdominal:     General: Bowel sounds are normal.     Palpations: Abdomen is soft. There is no mass.     Tenderness: There is no abdominal tenderness.  Musculoskeletal:     Cervical back: Normal range of motion and neck supple.       Assessment & Plan:   Malakie was seen today for follow up back pain, numbness in right leg.  Diagnoses and all orders for this visit:  Type 2 diabetes mellitus with diabetic  mononeuropathy, without long-term current use of insulin (HCC) -     Bayer DCA Hb A1c Waived -     CBC with Differential/Platelet -     CMP14+EGFR -     Lipid panel  Vasculogenic erectile dysfunction, unspecified vasculogenic erectile dysfunction type -     Testosterone,Free and Total  Lumbar pain  Numbness in right leg  Mixed hyperlipidemia  Need for hepatitis C screening test -     Hepatitis C antibody  Encounter for screening for HIV -     HIV Antibody (routine testing w rflx)  Other orders -     Discontinue: tadalafil (CIALIS) 5 MG tablet; Take 1 tablet (5 mg total) by mouth daily as needed for erectile dysfunction. -     gabapentin (NEURONTIN) 300 MG capsule; Take 5 capsules (1,500 mg total) by mouth 2 (two) times daily. -     tadalafil (CIALIS) 5 MG tablet; Take 1 tablet (5 mg total) by mouth daily.   Today I increased the gabapentin.  Asked him to change the Lipitor to evening and take it with a full meal.  Let me know if the nausea does not go away.  Additionally for the erectile dysfunction he should take daily Cialis.   I have changed Maks L. Kruger's gabapentin and tadalafil. I am also having him maintain his cetirizine, fluticasone, Janumet, atorvastatin, predniSONE, and tizanidine.  Meds ordered this encounter  Medications  . DISCONTD: tadalafil (CIALIS) 5 MG tablet    Sig: Take 1 tablet (5 mg total) by mouth daily as needed for erectile dysfunction.    Dispense:  10 tablet    Refill:  0  . gabapentin (NEURONTIN) 300 MG capsule    Sig: Take 5 capsules (1,500 mg total) by mouth 2 (two) times daily.    Dispense:  900 capsule    Refill:  1  . tadalafil (CIALIS) 5 MG tablet    Sig: Take 1 tablet (5 mg total) by mouth daily.    Dispense:  90 tablet    Refill:  1    Corrected presscription. Cancel earlier scrip, please     Follow-up: No follow-ups on file.  Claretta Fraise, M.D.

## 2019-08-11 LAB — LIPID PANEL
Chol/HDL Ratio: 5.2 ratio — ABNORMAL HIGH (ref 0.0–5.0)
Cholesterol, Total: 165 mg/dL (ref 100–199)
HDL: 32 mg/dL — ABNORMAL LOW (ref >39)
LDL Chol Calc (NIH): 86 mg/dL (ref 0–99)
Triglycerides: 285 mg/dL — ABNORMAL HIGH (ref 0–149)
VLDL Cholesterol Cal: 47 mg/dL — ABNORMAL HIGH (ref 5–40)

## 2019-08-11 LAB — CMP14+EGFR
ALT: 18 IU/L (ref 0–44)
AST: 10 IU/L (ref 0–40)
Albumin/Globulin Ratio: 2 (ref 1.2–2.2)
Albumin: 4.4 g/dL (ref 3.8–4.9)
Alkaline Phosphatase: 126 IU/L — ABNORMAL HIGH (ref 48–121)
BUN/Creatinine Ratio: 8 — ABNORMAL LOW (ref 9–20)
BUN: 8 mg/dL (ref 6–24)
Bilirubin Total: 0.3 mg/dL (ref 0.0–1.2)
CO2: 22 mmol/L (ref 20–29)
Calcium: 9.1 mg/dL (ref 8.7–10.2)
Chloride: 102 mmol/L (ref 96–106)
Creatinine, Ser: 0.99 mg/dL (ref 0.76–1.27)
GFR calc Af Amer: 102 mL/min/{1.73_m2} (ref >59)
GFR calc non Af Amer: 88 mL/min/{1.73_m2} (ref >59)
Globulin, Total: 2.2 g/dL (ref 1.5–4.5)
Glucose: 222 mg/dL — ABNORMAL HIGH (ref 65–99)
Potassium: 4.1 mmol/L (ref 3.5–5.2)
Sodium: 138 mmol/L (ref 134–144)
Total Protein: 6.6 g/dL (ref 6.0–8.5)

## 2019-08-11 LAB — CBC WITH DIFFERENTIAL/PLATELET
Basophils Absolute: 0.1 10*3/uL (ref 0.0–0.2)
Basos: 1 %
EOS (ABSOLUTE): 0.3 10*3/uL (ref 0.0–0.4)
Eos: 3 %
Hematocrit: 43 % (ref 37.5–51.0)
Hemoglobin: 14.7 g/dL (ref 13.0–17.7)
Immature Grans (Abs): 0 10*3/uL (ref 0.0–0.1)
Immature Granulocytes: 0 %
Lymphocytes Absolute: 2.8 10*3/uL (ref 0.7–3.1)
Lymphs: 29 %
MCH: 32 pg (ref 26.6–33.0)
MCHC: 34.2 g/dL (ref 31.5–35.7)
MCV: 94 fL (ref 79–97)
Monocytes Absolute: 1.5 10*3/uL — ABNORMAL HIGH (ref 0.1–0.9)
Monocytes: 16 %
Neutrophils Absolute: 5 10*3/uL (ref 1.4–7.0)
Neutrophils: 51 %
Platelets: 222 10*3/uL (ref 150–450)
RBC: 4.59 x10E6/uL (ref 4.14–5.80)
RDW: 13.1 % (ref 11.6–15.4)
WBC: 9.5 10*3/uL (ref 3.4–10.8)

## 2019-08-11 NOTE — Progress Notes (Signed)
Hello [place],  Your lab result is normal and/or stable.Some minor variations that are not significant are commonly marked abnormal, but do not represent any medical problem for you.  Best regards, Parry Po, M.D.

## 2019-08-13 ENCOUNTER — Encounter: Payer: Self-pay | Admitting: Family Medicine

## 2019-08-15 LAB — HEPATITIS C ANTIBODY: Hep C Virus Ab: <0.1 {s_co_ratio} (ref 0.0–0.9)

## 2019-08-15 LAB — TESTOSTERONE,FREE AND TOTAL
Testosterone, Free: 10.6 pg/mL (ref 7.2–24.0)
Testosterone: 293 ng/dL (ref 264–916)

## 2019-08-15 LAB — HIV ANTIBODY (ROUTINE TESTING W REFLEX): HIV Screen 4th Generation wRfx: NONREACTIVE

## 2019-08-28 ENCOUNTER — Other Ambulatory Visit: Payer: Self-pay

## 2019-08-28 DIAGNOSIS — R200 Anesthesia of skin: Secondary | ICD-10-CM

## 2019-08-29 ENCOUNTER — Encounter: Payer: BC Managed Care – PPO | Admitting: Neurology

## 2019-08-29 ENCOUNTER — Ambulatory Visit (INDEPENDENT_AMBULATORY_CARE_PROVIDER_SITE_OTHER): Payer: BC Managed Care – PPO | Admitting: Neurology

## 2019-08-29 ENCOUNTER — Other Ambulatory Visit: Payer: Self-pay

## 2019-08-29 DIAGNOSIS — R200 Anesthesia of skin: Secondary | ICD-10-CM

## 2019-08-29 DIAGNOSIS — E1142 Type 2 diabetes mellitus with diabetic polyneuropathy: Secondary | ICD-10-CM | POA: Diagnosis not present

## 2019-08-29 DIAGNOSIS — Z0289 Encounter for other administrative examinations: Secondary | ICD-10-CM

## 2019-08-29 DIAGNOSIS — M5416 Radiculopathy, lumbar region: Secondary | ICD-10-CM | POA: Diagnosis not present

## 2019-08-29 NOTE — Progress Notes (Signed)
Full Name: Matthew Thompson Gender: Male MRN #: [id number] Date of Birth: [birth year]    Visit Date: 08/29/2019 07:23 Age: 54 Years Examining Physician: Despina Arias, MD  Referring Physician: Mechele Claude, MD Height: 6 feet 4 inch     History: Matthew Thompson is a 54 year old man with right leg pain and back pain and bilateral Thompson numbness.  He has diabetes mellitus.  On exam, he had reduced sensation to both pinprick and vibration in the feet.  There was superimposed further reduced sensation in the L5 distribution of the right Thompson.  Strength was 4+/5 in the EHL muscles and was fairly symmetric.  Nerve conduction studies: Bilateral peroneal motor responses had normal amplitudes and distal latencies with reduced conduction velocities.  Bilateral tibial motor responses had reduced amplitudes with normal distal latencies and reduced conduction velocities.  Tibial F-wave latencies were prolonged.  Sural and superficial sensory responses in both feet had normal peak latencies with reduced amplitudes.  Electromyography: Needle EMG of selected muscles of both legs was performed.  In the right leg, there was mild to moderate chronic denervation in the tibialis anterior, peroneus longus and gluteus medius muscles and moderately severe chronic denervation in the abductor hallucis and extensor digitorum brevis muscles.  A few sharp waves were seen in the abductor hallucis muscle but not in the other Thompson muscles tested.  In the left leg, there was mild chronic denervation in the most distal abductor hallucis muscle but other muscles tested were normal.  Impression: This NCV/EMG study shows the following: 1.   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  This is consistent with a mild diabetic polyneuropathy. 2.   Superimposed chronic right L5 radiculopathy.  Matthew Sturgill A. Epimenio Foot, MD, PhD, FAAN Certified in Neurology, Clinical Neurophysiology, Sleep  Medicine, Pain Medicine and Neuroimaging Director, [facility]  [facility] [address], [address] [place], [place] [address] [phone]   Verbal informed consent was obtained from the patient, patient was informed of potential risk of procedure, including bruising, bleeding, hematoma formation, infection, muscle weakness, muscle pain, numbness, among others.         MNC    Nerve / Sites Muscle Latency Ref. Amplitude Ref. Rel Amp Segments Distance Velocity Ref. Area    ms ms mV mV %  cm m/s m/s mVms  L Peroneal - EDB     Ankle EDB 4.9 ?6.5 7.3 ?2.0 100 Ankle - EDB 9   22.1     Fib head EDB 14.6  5.6  76.3 Fib head - Ankle 37 38 ?44 20.0     Pop fossa EDB 17.2  4.9  88.7 Pop fossa - Fib head 10 38 ?44 18.9         Pop fossa - Ankle      R Peroneal - EDB     Ankle EDB 5.3 ?6.5 4.6 ?2.0 100 Ankle - EDB 9   13.0     Fib head EDB 15.7  3.6  78.4 Fib head - Ankle 37 36 ?44 11.3     Pop fossa EDB 18.2  3.5  98.3 Pop fossa - Fib head 10 39 ?44 10.2         Pop fossa - Ankle      L Tibial - AH     Ankle AH 4.9 ?5.8 1.7 ?4.0 100 Ankle - AH 9   5.1     Pop fossa  AH 20.1  0.7  43.4 Pop fossa - Ankle 46 30 ?41 4.1  R Tibial - AH     Ankle AH 5.0 ?5.8 0.5 ?4.0 100 Ankle - AH 9   2.0     Pop fossa AH 21.6  0.3  54.8 Pop fossa - Ankle 47 28 ?41 1.3             SNC    Nerve / Sites Rec. Site Peak Lat Ref.  Amp Ref. Segments Distance    ms ms V V  cm  L Sural - Ankle (Calf)     Calf Ankle 4.3 ?4.4 4 ?6 Calf - Ankle 14  R Sural - Ankle (Calf)     Calf Ankle 4.0 ?4.4 4 ?6 Calf - Ankle 14  L Superficial peroneal - Ankle     Lat leg Ankle 4.4 ?4.4 2 ?6 Lat leg - Ankle 14  R Superficial peroneal - Ankle     Lat leg Ankle 4.0 ?4.4 2 ?6 Lat leg - Ankle 14             F  Wave    Nerve F Lat Ref.   ms ms  L Tibial - AH 79.3 ?56.0  R Tibial - AH 75.1 ?56.0         EMG Summary Table    Spontaneous MUAP Recruitment  Muscle  IA Fib PSW Fasc Other Amp Dur. Poly Pattern  R. Vastus medialis Normal None None None _______ Normal Normal Normal Normal  R. Tibialis anterior Normal None None None _______ Increased Increased 3+ Reduced  R. Peroneus longus Normal None None None _______ Increased Increased 3+ Reduced  R. Gastrocnemius (Medial head) Normal None None None _______ Normal Normal 1+ Normal  R. Abductor hallucis Normal None 1+ None _______ Normal Increased 2+ Discrete  R. Extensor digitorum brevis Normal None None None _______ Increased Increased 2+ Discrete  R. Gluteus medius Normal None None None _______ Normal Normal 1+ Reduced  R. Iliopsoas Normal None None None _______ Normal Normal Normal Normal  L. Vastus medialis Normal None None None _______ Normal Normal Normal Normal  L. Peroneus longus Normal None None None _______ Normal Normal Normal Normal  L. Tibialis anterior Normal None None None _______ Normal Normal Normal Normal  L. Gastrocnemius (Medial head) Normal None None None _______ Normal Normal Normal Normal  L. Abductor hallucis Normal None None None _______ Normal Normal Normal Normal

## 2019-08-31 ENCOUNTER — Telehealth: Payer: Self-pay | Admitting: Family Medicine

## 2019-08-31 NOTE — Telephone Encounter (Signed)
Patient has had ongoing numbness in feet and legs, recently saw a neurologist for same problem.  Wondering what is next and what should be done.  Appointment scheduled to discuss with Dr. Darlyn Read on 09/13/2019.

## 2019-09-01 ENCOUNTER — Telehealth: Payer: Self-pay | Admitting: Family Medicine

## 2019-09-01 NOTE — Telephone Encounter (Signed)
Pts wife states he is at work in a lot of pain with his nerve issue and is wanting to see if something for pain can be called in for him to [facility] in [place].

## 2019-09-13 ENCOUNTER — Other Ambulatory Visit: Payer: Self-pay

## 2019-09-13 ENCOUNTER — Ambulatory Visit (INDEPENDENT_AMBULATORY_CARE_PROVIDER_SITE_OTHER): Payer: BC Managed Care – PPO | Admitting: Family Medicine

## 2019-09-13 ENCOUNTER — Ambulatory Visit (INDEPENDENT_AMBULATORY_CARE_PROVIDER_SITE_OTHER): Payer: BC Managed Care – PPO

## 2019-09-13 ENCOUNTER — Encounter: Payer: Self-pay | Admitting: Family Medicine

## 2019-09-13 VITALS — BP 131/82 | HR 69 | Temp 98.5°F | Ht 76.0 in | Wt 247.2 lb

## 2019-09-13 DIAGNOSIS — G959 Disease of spinal cord, unspecified: Secondary | ICD-10-CM | POA: Diagnosis not present

## 2019-09-13 DIAGNOSIS — E1142 Type 2 diabetes mellitus with diabetic polyneuropathy: Secondary | ICD-10-CM | POA: Diagnosis not present

## 2019-09-13 DIAGNOSIS — M5416 Radiculopathy, lumbar region: Secondary | ICD-10-CM

## 2019-09-13 DIAGNOSIS — M25511 Pain in right shoulder: Secondary | ICD-10-CM

## 2019-09-13 MED ORDER — PREGABALIN 50 MG PO CAPS: ORAL_CAPSULE | ORAL | 1 refills | Status: DC

## 2019-09-20 ENCOUNTER — Other Ambulatory Visit: Payer: Self-pay

## 2019-09-20 ENCOUNTER — Ambulatory Visit (HOSPITAL_COMMUNITY)
Admission: RE | Admit: 2019-09-20 | Discharge: 2019-09-20 | Disposition: A | Discharge status: Home / Self Care | Payer: BC Managed Care – PPO | Source: Ambulatory Visit | Attending: Family Medicine | Admitting: Family Medicine

## 2019-09-20 DIAGNOSIS — G959 Disease of spinal cord, unspecified: Secondary | ICD-10-CM | POA: Diagnosis not present

## 2019-09-21 ENCOUNTER — Encounter: Payer: Self-pay | Admitting: Family Medicine

## 2019-09-21 ENCOUNTER — Telehealth: Payer: Self-pay | Admitting: Family Medicine

## 2019-09-21 NOTE — Telephone Encounter (Signed)
Pt states that he had a MRI yesterday and the hospital could not write a return to work note for him today. He needs the note before he goes into work at [employer]. Please advise.

## 2019-09-21 NOTE — Telephone Encounter (Signed)
On stacks desk to sign

## 2019-09-21 NOTE — Telephone Encounter (Signed)
Please  write and I will sign. Thanks, WS 

## 2019-09-21 NOTE — Telephone Encounter (Signed)
Please review and advise.

## 2019-09-23 NOTE — Progress Notes (Signed)
Subjective:  Patient ID: Matthew Thompson, male    DOB: [birth year]  Age: 54 y.o. MRN: [id number]  CC: Numbness (Legs) and Shoulder Pain   HPI Matthew Thompson presents for pain in shoulder after unloading 9 pallets that were loaded with 11,000 pounds. Right shoulder has been painful ever since. Additionally Matthew Thompson continues to have numbness and tingling in the feet.  Depression screen [facility] 2/9 09/13/2019 08/10/2019 07/26/2019  Decreased Interest 0 0 0  Down, Depressed, Hopeless 0 0 0  PHQ - 2 Score 0 0 0  Altered sleeping - - -  Tired, decreased energy - - -  Change in appetite - - -  Feeling bad or failure about yourself  - - -  Trouble concentrating - - -  Suicidal thoughts - - -  PHQ-9 Score - - -    History Matthew Thompson has a past medical history of Diabetes mellitus without complication (HCC) and Hypertension.   Matthew Thompson has a past surgical history that includes left hand and Hernia repair.   His family history includes Cancer in his father and mother; Diabetes in his brother, father, mother, and sister; Heart disease in his mother; Hypertension in his brother, father, mother, and sister; Stroke in his mother.Matthew Thompson reports that Matthew Thompson has been smoking cigarettes. Matthew Thompson has a 10.50 pack-year smoking history. Matthew Thompson has never used smokeless tobacco. Matthew Thompson reports current alcohol use. Matthew Thompson reports that Matthew Thompson does not use drugs.    ROS Review of Systems  Constitutional: Negative for fever.  Respiratory: Negative for shortness of breath.   Cardiovascular: Negative for chest pain.  Musculoskeletal: Negative for arthralgias.  Skin: Negative for rash.    Objective:  BP 131/82   Pulse 69   Temp 98.5 F (36.9 C) (Temporal)   Ht 6\' 4"  (1.93 m)   Wt 247 lb 3.2 oz (112.1 kg)   BMI 30.09 kg/m   BP Readings from Last 3 Encounters:  09/13/19 131/82  08/10/19 (!) 157/93  07/26/19 137/82    Wt Readings from Last 3 Encounters:  09/13/19 247 lb 3.2 oz (112.1 kg)  08/10/19 247 lb 6 oz (112.2 kg)  07/26/19 251 lb  (113.9 kg)     Physical Exam Vitals reviewed.  Constitutional:      Appearance: Matthew Thompson is well-developed.  HENT:     Head: Normocephalic and atraumatic.     Right Ear: External ear normal.     Left Ear: External ear normal.     Mouth/Throat:     Pharynx: No oropharyngeal exudate or posterior oropharyngeal erythema.  Eyes:     Pupils: Pupils are equal, round, and reactive to light.  Cardiovascular:     Rate and Rhythm: Normal rate and regular rhythm.     Heart sounds: No murmur heard.   Pulmonary:     Effort: No respiratory distress.     Breath sounds: Normal breath sounds.  Musculoskeletal:        General: Tenderness (at the right shoulder) present.     Cervical back: Normal range of motion and neck supple.  Neurological:     Mental Status: Matthew Thompson is alert and oriented to person, place, and time.       Assessment & Plan:   Matthew Thompson was seen today for numbness and shoulder pain.  Diagnoses and all orders for this visit:  Acute pain of right shoulder -     DG Shoulder Right; Future  Diabetic polyneuropathy associated with type 2 diabetes mellitus (HCC)  Right lumbar radiculopathy  Disease of spinal cord (HCC) -     MR Lumbar Spine Wo Contrast; Future  Other orders -     pregabalin (LYRICA) 50 MG capsule; 1 qhs X7 days , then 2 qhs X 7d, then 3 qhs X 7d, then 4 qhs       I am having Matthew Thompson start on pregabalin. I am also having him maintain his cetirizine, fluticasone, Janumet, atorvastatin, predniSONE, tizanidine, gabapentin, and tadalafil.  Allergies as of 09/13/2019   No Known Allergies     Medication List       Accurate as of September 13, 2019 11:59 PM. If you have any questions, ask your nurse or doctor.        atorvastatin 40 MG tablet Commonly known as: LIPITOR Take 1 tablet (40 mg total) by mouth daily. For cholesterol   cetirizine 10 MG tablet Commonly known as: ZYRTEC Take 1 tablet (10 mg total) by mouth daily.   fluticasone 50 MCG/ACT  nasal spray Commonly known as: FLONASE Place 2 sprays into both nostrils daily.   gabapentin 300 MG capsule Commonly known as: NEURONTIN Take 5 capsules (1,500 mg total) by mouth 2 (two) times daily.   Janumet 50-1000 MG tablet Generic drug: sitaGLIPtin-metformin Take 1 tablet by mouth 2 (two) times daily.   predniSONE 10 MG tablet Commonly known as: DELTASONE Take 5 daily for 3 days followed by 4,3,2 and 1 for 3 days each.   pregabalin 50 MG capsule Commonly known as: Lyrica 1 qhs X7 days , then 2 qhs X 7d, then 3 qhs X 7d, then 4 qhs Started by: Mechele Claude, MD   tadalafil 5 MG tablet Commonly known as: Cialis Take 1 tablet (5 mg total) by mouth daily.   tizanidine 6 MG capsule Commonly known as: ZANAFLEX Take 1 capsule (6 mg total) by mouth at bedtime. To relax muscles        Follow-up: Return in about 1 month (around 10/14/2019).  Mechele Claude, M.D.

## 2019-09-25 NOTE — Telephone Encounter (Signed)
Pt aware work note ready to be picked up

## 2019-10-21 ENCOUNTER — Other Ambulatory Visit: Payer: Self-pay | Admitting: Family

## 2019-10-21 DIAGNOSIS — J301 Allergic rhinitis due to pollen: Secondary | ICD-10-CM

## 2019-11-13 ENCOUNTER — Ambulatory Visit (INDEPENDENT_AMBULATORY_CARE_PROVIDER_SITE_OTHER): Payer: BC Managed Care – PPO | Admitting: Family Medicine

## 2019-11-13 ENCOUNTER — Encounter: Payer: Self-pay | Admitting: Family Medicine

## 2019-11-13 ENCOUNTER — Other Ambulatory Visit: Payer: Self-pay

## 2019-11-13 VITALS — BP 148/79 | HR 72 | Temp 97.6°F | Ht 76.0 in | Wt 244.4 lb

## 2019-11-13 DIAGNOSIS — E782 Mixed hyperlipidemia: Secondary | ICD-10-CM | POA: Diagnosis not present

## 2019-11-13 DIAGNOSIS — N529 Male erectile dysfunction, unspecified: Secondary | ICD-10-CM | POA: Diagnosis not present

## 2019-11-13 DIAGNOSIS — E1141 Type 2 diabetes mellitus with diabetic mononeuropathy: Secondary | ICD-10-CM

## 2019-11-13 DIAGNOSIS — M5416 Radiculopathy, lumbar region: Secondary | ICD-10-CM

## 2019-11-13 DIAGNOSIS — E1142 Type 2 diabetes mellitus with diabetic polyneuropathy: Secondary | ICD-10-CM | POA: Diagnosis not present

## 2019-11-13 LAB — BAYER DCA HB A1C WAIVED: HB A1C (BAYER DCA - WAIVED): 8.3 % — ABNORMAL HIGH (ref <7.0)

## 2019-11-13 NOTE — Progress Notes (Signed)
Subjective:  Patient ID: Matthew Thompson, male    DOB: [birth year]  Age: 54 y.o. MRN: [id number]  CC: Medical Management of Chronic Issues   HPI Matthew Thompson presents forFollow-up of diabetes. Patient checks blood sugar at home.  He has gotten off track from his diet and exercise regimen because of the symptoms noted below.  Patient denies symptoms such as polyuria, polydipsia, excessive hunger, nausea No significant hypoglycemic spells noted. Medications reviewed. Pt reports taking them regularly without complication/adverse reaction being reported today.    Right lower extremity numbness and weakness noted.  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  No herniated disc but there is significant degenerative disc disease.  Patient states his low back pain is 7/10 most of the time including the weakness and numbness of the right lower extremity and back.  He could not tolerate the Lyrica.  It tore up the stomach.  He has continued to take the gabapentin instead of 1500 mg twice daily.  In spite of this the pain remains 7/10.  History Matthew Thompson has a past medical history of Diabetes mellitus without complication ([place]) and Hypertension.   He has a past surgical history that includes left hand and Hernia repair.   His family history includes Cancer in his father and mother; Diabetes in his brother, father, mother, and sister; Heart disease in his mother; Hypertension in his brother, father, mother, and sister; Stroke in his mother.He reports that he has been smoking cigarettes. He has a 10.50 pack-year smoking history. He has never used smokeless tobacco. He reports current alcohol use. He reports that he does not use drugs.  Current Outpatient Medications on File Prior to Visit  Medication Sig Dispense Refill  . atorvastatin (LIPITOR) 40 MG tablet Take 1 tablet (40 mg total) by mouth daily. For cholesterol 90 tablet 3  . cetirizine (ZYRTEC) 10 MG tablet TAKE 1 TABLET  (10 MG) BY MOUTH DAILY 90 tablet 1  . fluticasone (FLONASE) 50 MCG/ACT nasal spray Place 2 sprays into both nostrils daily. 16 g 6  . gabapentin (NEURONTIN) 300 MG capsule Take 5 capsules (1,500 mg total) by mouth 2 (two) times daily. 900 capsule 1  . JANUMET 50-1000 MG tablet Take 1 tablet by mouth 2 (two) times daily. 90 tablet 1  . tizanidine (ZANAFLEX) 6 MG capsule Take 1 capsule (6 mg total) by mouth at bedtime. To relax muscles 30 capsule 2  . predniSONE (DELTASONE) 10 MG tablet Take 5 daily for 3 days followed by 4,3,2 and 1 for 3 days each. (Patient not taking: Reported on 11/13/2019) 45 tablet 0  . pregabalin (LYRICA) 50 MG capsule 1 qhs X7 days , then 2 qhs X 7d, then 3 qhs X 7d, then 4 qhs (Patient not taking: Reported on 11/13/2019) 120 capsule 1  . tadalafil (CIALIS) 5 MG tablet Take 1 tablet (5 mg total) by mouth daily. (Patient not taking: Reported on 09/13/2019) 90 tablet 1   No current facility-administered medications on file prior to visit.    ROS Review of Systems  Constitutional: Negative for fever.  Respiratory: Negative for shortness of breath.   Cardiovascular: Negative for chest pain.  Musculoskeletal: Positive for arthralgias.  Skin: Negative for rash.    Objective:  BP (!) 148/79   Pulse 72   Temp 97.6 F (36.4 C) (Temporal)   Ht _0  (1.93 m)   Wt 244 lb 6.4 oz (110.9 kg)   BMI 29.75 kg/m  BP Readings from Last 3 Encounters:  11/13/19 (!) 148/79  09/13/19 131/82  08/10/19 (!) 157/93    Wt Readings from Last 3 Encounters:  11/13/19 244 lb 6.4 oz (110.9 kg)  09/13/19 247 lb 3.2 oz (112.1 kg)  08/10/19 247 lb 6 oz (112.2 kg)     Physical Exam Vitals reviewed.  Constitutional:      Appearance: He is well-developed.  HENT:     Head: Normocephalic and atraumatic.     Right Ear: External ear normal.     Left Ear: External ear normal.     Mouth/Throat:     Pharynx: No oropharyngeal exudate or posterior oropharyngeal erythema.  Eyes:      Pupils: Pupils are equal, round, and reactive to light.  Cardiovascular:     Rate and Rhythm: Normal rate and regular rhythm.     Heart sounds: No murmur heard.   Pulmonary:     Effort: No respiratory distress.     Breath sounds: Normal breath sounds.  Musculoskeletal:     Cervical back: Normal range of motion and neck supple.  Neurological:     Mental Status: He is alert and oriented to person, place, and time.       Assessment & Plan:   Shawna was seen today for medical management of chronic issues.  Diagnoses and all orders for this visit:  Diabetic polyneuropathy associated with type 2 diabetes mellitus ([place]) -     CBC with Differential/Platelet -     CMP14+EGFR -     Bayer DCA Hb A1c Waived  Type 2 diabetes mellitus with diabetic mononeuropathy, without long-term current use of insulin (HCC) -     CBC with Differential/Platelet -     CMP14+EGFR -     Bayer DCA Hb A1c Waived  Mixed hyperlipidemia -     CBC with Differential/Platelet -     CMP14+EGFR -     Lipid panel  Vasculogenic erectile dysfunction, unspecified vasculogenic erectile dysfunction type -     CBC with Differential/Platelet -     CMP14+EGFR -     Testosterone,Free and Total  Right lumbar radiculopathy -     Ambulatory referral to Neurosurgery -     Ambulatory referral to Physical Therapy      I am having Matthew Thompson maintain his fluticasone, Janumet, atorvastatin, predniSONE, tizanidine, gabapentin, tadalafil, pregabalin, and cetirizine.  No orders of the defined types were placed in this encounter.    Follow-up: Return in about 3 months (around 02/13/2020), or if symptoms worsen or fail to improve.  Claretta Fraise, M.D.

## 2019-11-15 ENCOUNTER — Telehealth: Payer: Self-pay | Admitting: Family Medicine

## 2019-11-15 NOTE — Telephone Encounter (Signed)
It is in process  

## 2019-11-17 LAB — CBC WITH DIFFERENTIAL/PLATELET
Basophils Absolute: 0 10*3/uL (ref 0.0–0.2)
Basos: 1 %
EOS (ABSOLUTE): 0.1 10*3/uL (ref 0.0–0.4)
Eos: 1 %
Hematocrit: 44.4 % (ref 37.5–51.0)
Hemoglobin: 15.1 g/dL (ref 13.0–17.7)
Immature Grans (Abs): 0 10*3/uL (ref 0.0–0.1)
Immature Granulocytes: 0 %
Lymphocytes Absolute: 2.5 10*3/uL (ref 0.7–3.1)
Lymphs: 30 %
MCH: 31.9 pg (ref 26.6–33.0)
MCHC: 34 g/dL (ref 31.5–35.7)
MCV: 94 fL (ref 79–97)
Monocytes Absolute: 1.1 10*3/uL — ABNORMAL HIGH (ref 0.1–0.9)
Monocytes: 13 %
Neutrophils Absolute: 4.5 10*3/uL (ref 1.4–7.0)
Neutrophils: 55 %
Platelets: 261 10*3/uL (ref 150–450)
RBC: 4.73 x10E6/uL (ref 4.14–5.80)
RDW: 13.1 % (ref 11.6–15.4)
WBC: 8.4 10*3/uL (ref 3.4–10.8)

## 2019-11-17 LAB — CMP14+EGFR
ALT: 31 IU/L (ref 0–44)
AST: 20 IU/L (ref 0–40)
Albumin/Globulin Ratio: 1.8 (ref 1.2–2.2)
Albumin: 4.6 g/dL (ref 3.8–4.9)
Alkaline Phosphatase: 114 IU/L (ref 44–121)
BUN/Creatinine Ratio: 11 (ref 9–20)
BUN: 11 mg/dL (ref 6–24)
Bilirubin Total: 0.5 mg/dL (ref 0.0–1.2)
CO2: 22 mmol/L (ref 20–29)
Calcium: 9.2 mg/dL (ref 8.7–10.2)
Chloride: 104 mmol/L (ref 96–106)
Creatinine, Ser: 0.99 mg/dL (ref 0.76–1.27)
GFR calc Af Amer: 102 mL/min/{1.73_m2} (ref >59)
GFR calc non Af Amer: 88 mL/min/{1.73_m2} (ref >59)
Globulin, Total: 2.6 g/dL (ref 1.5–4.5)
Glucose: 144 mg/dL — ABNORMAL HIGH (ref 65–99)
Potassium: 4.2 mmol/L (ref 3.5–5.2)
Sodium: 139 mmol/L (ref 134–144)
Total Protein: 7.2 g/dL (ref 6.0–8.5)

## 2019-11-17 LAB — LIPID PANEL
Chol/HDL Ratio: 3.1 ratio (ref 0.0–5.0)
Cholesterol, Total: 105 mg/dL (ref 100–199)
HDL: 34 mg/dL — ABNORMAL LOW (ref >39)
LDL Chol Calc (NIH): 46 mg/dL (ref 0–99)
Triglycerides: 142 mg/dL (ref 0–149)
VLDL Cholesterol Cal: 25 mg/dL (ref 5–40)

## 2019-11-17 LAB — TESTOSTERONE,FREE AND TOTAL
Testosterone, Free: 7.9 pg/mL (ref 7.2–24.0)
Testosterone: 243 ng/dL — ABNORMAL LOW (ref 264–916)

## 2019-11-19 ENCOUNTER — Encounter: Payer: Self-pay | Admitting: Family Medicine

## 2019-11-24 ENCOUNTER — Encounter: Payer: Self-pay | Admitting: *Deleted

## 2019-12-02 ENCOUNTER — Other Ambulatory Visit: Payer: Self-pay | Admitting: Family Medicine

## 2019-12-06 ENCOUNTER — Encounter: Payer: Self-pay | Admitting: Family Medicine

## 2019-12-06 ENCOUNTER — Ambulatory Visit (INDEPENDENT_AMBULATORY_CARE_PROVIDER_SITE_OTHER): Payer: BC Managed Care – PPO | Admitting: Family Medicine

## 2019-12-06 VITALS — BP 132/88 | HR 94 | Temp 97.2°F | Resp 20 | Ht 76.0 in | Wt 244.0 lb

## 2019-12-06 DIAGNOSIS — N453 Epididymo-orchitis: Secondary | ICD-10-CM

## 2019-12-06 MED ORDER — CEFTRIAXONE SODIUM 1 G IJ SOLR
1.0000 g | Freq: Once | INTRAMUSCULAR | Status: AC
  Administered 2019-12-06: 1 g via INTRAMUSCULAR

## 2019-12-06 NOTE — Progress Notes (Signed)
Chief Complaint  Patient presents with  . Abdominal Pain  . Fever    HPI  Patient presents today for testicular pain radiating up into the suprapubic area of the abdomen.  Started 2 days ago.  Significantly worse over that time.  Swelling is noted on the left.  He is concerned that the hernia has recurred.  He had a hernia repair in that area many years ago.  PMH: Smoking status noted ROS: Per HPI  Objective: BP 132/88   Pulse 94   Temp (!) 97.2 F (36.2 C) (Temporal)   Resp 20   Ht 6\' 4"  (1.93 m)   Wt 244 lb (110.7 kg)   SpO2 99%   BMI 29.70 kg/m  Gen: NAD, alert, cooperative with exam HEENT: NCAT, EOMI, PERRL CV: RRR, good S1/S2, no murmur Resp: CTABL, no wheezes, non-labored Abd: SNTND, BS present, no guarding or organomegaly.  The right testicle is essentially normal.  The left is indurated and edematous to approximately 3 times its normal size.  No hernia could be palpated.  The patient is exquisitely tender. Ext: No edema, warm Neuro: Alert and oriented, No gross deficits  Assessment and plan:  1. Orchitis and epididymitis     Meds ordered this encounter  Medications  . cefTRIAXone (ROCEPHIN) injection 1 g    Due to my concerns for torsion and or infarction as well as infection the patient was referred immediately to [facility] emergency department for further evaluation.  Follow up as needed.  [facility], MD

## 2019-12-07 ENCOUNTER — Emergency Department (HOSPITAL_COMMUNITY): Payer: BC Managed Care – PPO

## 2019-12-07 ENCOUNTER — Encounter (HOSPITAL_COMMUNITY): Payer: Self-pay

## 2019-12-07 ENCOUNTER — Other Ambulatory Visit: Payer: Self-pay

## 2019-12-07 ENCOUNTER — Emergency Department (HOSPITAL_COMMUNITY)
Admission: EM | Admit: 2019-12-07 | Discharge: 2019-12-07 | Disposition: A | Discharge status: Home / Self Care | Payer: BC Managed Care – PPO | Attending: Emergency Medicine | Admitting: Emergency Medicine

## 2019-12-07 DIAGNOSIS — E1165 Type 2 diabetes mellitus with hyperglycemia: Secondary | ICD-10-CM | POA: Diagnosis not present

## 2019-12-07 DIAGNOSIS — Z79899 Other long term (current) drug therapy: Secondary | ICD-10-CM | POA: Insufficient documentation

## 2019-12-07 DIAGNOSIS — Z7984 Long term (current) use of oral hypoglycemic drugs: Secondary | ICD-10-CM | POA: Diagnosis not present

## 2019-12-07 DIAGNOSIS — E1142 Type 2 diabetes mellitus with diabetic polyneuropathy: Secondary | ICD-10-CM | POA: Insufficient documentation

## 2019-12-07 DIAGNOSIS — F17210 Nicotine dependence, cigarettes, uncomplicated: Secondary | ICD-10-CM | POA: Diagnosis not present

## 2019-12-07 DIAGNOSIS — N453 Epididymo-orchitis: Secondary | ICD-10-CM | POA: Insufficient documentation

## 2019-12-07 DIAGNOSIS — I10 Essential (primary) hypertension: Secondary | ICD-10-CM | POA: Diagnosis not present

## 2019-12-07 DIAGNOSIS — N50819 Testicular pain, unspecified: Secondary | ICD-10-CM | POA: Diagnosis present

## 2019-12-07 LAB — CBC WITH DIFFERENTIAL/PLATELET
Abs Immature Granulocytes: 0.05 10*3/uL (ref 0.00–0.07)
Basophils Absolute: 0 10*3/uL (ref 0.0–0.1)
Basophils Relative: 0 %
Eosinophils Absolute: 0.1 10*3/uL (ref 0.0–0.5)
Eosinophils Relative: 1 %
HCT: 40.7 % (ref 39.0–52.0)
Hemoglobin: 13.6 g/dL (ref 13.0–17.0)
Immature Granulocytes: 1 %
Lymphocytes Relative: 21 %
Lymphs Abs: 2.2 10*3/uL (ref 0.7–4.0)
MCH: 31.9 pg (ref 26.0–34.0)
MCHC: 33.4 g/dL (ref 30.0–36.0)
MCV: 95.5 fL (ref 80.0–100.0)
Monocytes Absolute: 1.7 10*3/uL — ABNORMAL HIGH (ref 0.1–1.0)
Monocytes Relative: 16 %
Neutro Abs: 6.4 10*3/uL (ref 1.7–7.7)
Neutrophils Relative %: 61 %
Platelets: 293 10*3/uL (ref 150–400)
RBC: 4.26 MIL/uL (ref 4.22–5.81)
RDW: 13.5 % (ref 11.5–15.5)
WBC: 10.5 10*3/uL (ref 4.0–10.5)
nRBC: 0 % (ref 0.0–0.2)

## 2019-12-07 LAB — BASIC METABOLIC PANEL
Anion gap: 8 (ref 5–15)
BUN: 8 mg/dL (ref 6–20)
CO2: 24 mmol/L (ref 22–32)
Calcium: 8.8 mg/dL — ABNORMAL LOW (ref 8.9–10.3)
Chloride: 101 mmol/L (ref 98–111)
Creatinine, Ser: 0.82 mg/dL (ref 0.61–1.24)
GFR, Estimated: >60 mL/min (ref >60)
Glucose, Bld: 109 mg/dL — ABNORMAL HIGH (ref 70–99)
Potassium: 3.8 mmol/L (ref 3.5–5.1)
Sodium: 133 mmol/L — ABNORMAL LOW (ref 135–145)

## 2019-12-07 LAB — URINALYSIS, ROUTINE W REFLEX MICROSCOPIC
Bilirubin Urine: NEGATIVE
Glucose, UA: NEGATIVE mg/dL
Ketones, ur: NEGATIVE mg/dL
Nitrite: NEGATIVE
Protein, ur: 100 mg/dL — AB
Specific Gravity, Urine: 1.02 (ref 1.005–1.030)
WBC, UA: >50 WBC/hpf — ABNORMAL HIGH (ref 0–5)
pH: 6 (ref 5.0–8.0)

## 2019-12-07 MED ORDER — LEVOFLOXACIN 500 MG PO TABS: 500.0000 mg | ORAL_TABLET | Freq: Every day | ORAL | 0 refills | Status: DC

## 2019-12-07 MED ORDER — HYDROCODONE-ACETAMINOPHEN 5-325 MG PO TABS
1.0000 | ORAL_TABLET | Freq: Four times a day (QID) | ORAL | 0 refills | Status: DC | PRN
Start: 2019-12-07 — End: 2020-07-17

## 2019-12-07 MED ORDER — ONDANSETRON 4 MG PO TBDP: 4.0000 mg | ORAL_TABLET | Freq: Three times a day (TID) | ORAL | 0 refills | Status: DC | PRN

## 2019-12-07 MED ORDER — IBUPROFEN 600 MG PO TABS: 600.0000 mg | ORAL_TABLET | Freq: Three times a day (TID) | ORAL | 0 refills | Status: AC

## 2019-12-07 NOTE — ED Provider Notes (Signed)
[facility] EMERGENCY DEPARTMENT Provider Note   CSN: [id number] Arrival date & time: 12/07/19  4166     History Chief Complaint  Patient presents with  . Groin Pain    Matthew Thompson is a 54 y.o. male.  HPI      Matthew Thompson is a 54 y.o. male, with a history of DM, HTN, presenting to the ED with left testicular pain for the last 3 days.  Pain is aching, currently 8/10, radiating toward the lower abdomen, worsening, accompanied by swelling in the scrotum. He has been having regular bowel movements. He is sexually active with 1 male partner.  Denies insertive anal intercourse.  Denies fever/chills, N/V/C/D, hematochezia/melena, hematuria, dysuria, pain with bowel movements, other abdominal discomfort, rectal pain, perineal pain, penile discharge, trauma, or any other complaints.  Past Medical History:  Diagnosis Date  . Diabetes mellitus without complication (HCC)   . Hypertension     Patient Active Problem List   Diagnosis Date Noted  . Diabetic polyneuropathy associated with type 2 diabetes mellitus (HCC) 08/29/2019  . Right lumbar radiculopathy 08/29/2019  . Numbness 08/29/2019  . Type 2 diabetes mellitus with hyperglycemia, without long-term current use of insulin (HCC) 10/03/2018  . Diabetes mellitus, new onset (HCC) 04/13/2018    Past Surgical History:  Procedure Laterality Date  . HERNIA REPAIR    . left hand         Family History  Problem Relation Age of Onset  . Stroke Mother   . Heart disease Mother   . Diabetes Mother   . Hypertension Mother   . Cancer Mother   . Hypertension Father   . Diabetes Father   . Cancer Father   . Diabetes Sister   . Hypertension Sister   . Diabetes Brother   . Hypertension Brother     Social History   Tobacco Use  . Smoking status: Current Every Day Smoker    Packs/day: 0.50    Years: 21.00    Pack years: 10.50    Types: Cigarettes  . Smokeless tobacco: Never Used  Vaping Use  . Vaping Use: Never  used  Substance Use Topics  . Alcohol use: Yes    Comment: occassionally  . Drug use: No    Home Medications Prior to Admission medications   Medication Sig Start Date End Date Taking? Authorizing Provider  atorvastatin (LIPITOR) 40 MG tablet Take 1 tablet (40 mg total) by mouth daily. For cholesterol 04/14/19   Mechele Claude, MD  cetirizine (ZYRTEC) 10 MG tablet TAKE 1 TABLET (10 MG) BY MOUTH DAILY 10/23/19   Jannifer Rodney A, FNP  fluticasone (FLONASE) 50 MCG/ACT nasal spray Place 2 sprays into both nostrils daily. 08/22/18   Junie Spencer, FNP  gabapentin (NEURONTIN) 300 MG capsule Take 5 capsules (1,500 mg total) by mouth 2 (two) times daily. 08/10/19   Mechele Claude, MD  HYDROcodone-acetaminophen (NORCO/VICODIN) 5-325 MG tablet Take 1-2 tablets by mouth every 6 (six) hours as needed for severe pain. 12/07/19   Johnryan Sao C, PA-C  ibuprofen (ADVIL) 600 MG tablet Take 1 tablet (600 mg total) by mouth 3 (three) times daily for 10 days. 12/07/19 12/17/19  Sayge Salvato C, PA-C  JANUMET 50-1000 MG tablet TAKE 1 TABLET BY MOUTH 2 (TWO) TIMES DAILY WITH A MEAL 12/04/19   Mechele Claude, MD  levofloxacin (LEVAQUIN) 500 MG tablet Take 1 tablet (500 mg total) by mouth daily. 12/07/19   Deasiah Hagberg C, PA-C  ondansetron (ZOFRAN ODT)  4 MG disintegrating tablet Take 1 tablet (4 mg total) by mouth every 8 (eight) hours as needed for nausea or vomiting. 12/07/19   Tahiry Spicer C, PA-C  pregabalin (LYRICA) 50 MG capsule 1 qhs X7 days , then 2 qhs X 7d, then 3 qhs X 7d, then 4 qhs Patient not taking: Reported on 11/13/2019 09/13/19   Mechele ClaudeStacks, Warren, MD  tadalafil (CIALIS) 5 MG tablet Take 1 tablet (5 mg total) by mouth daily. Patient not taking: Reported on 09/13/2019 08/10/19   Mechele ClaudeStacks, Warren, MD  tizanidine (ZANAFLEX) 6 MG capsule Take 1 capsule (6 mg total) by mouth at bedtime. To relax muscles Patient not taking: Reported on 12/06/2019 07/26/19   Mechele ClaudeStacks, Warren, MD    Allergies    Patient has no known  allergies.  Review of Systems   Review of Systems  Constitutional: Negative for chills and fever.  Respiratory: Negative for shortness of breath.   Cardiovascular: Negative for chest pain.  Gastrointestinal: Negative for blood in stool, constipation, diarrhea, nausea and vomiting.  Genitourinary: Positive for testicular pain. Negative for difficulty urinating, discharge, dysuria, flank pain, genital sores and hematuria.  Musculoskeletal: Negative for back pain.  All other systems reviewed and are negative.   Physical Exam Updated Vital Signs BP (!) 144/81 (BP Location: Right Arm)   Pulse 87   Temp 98.8 F (37.1 C) (Oral)   Resp 18   Ht 6\' 4"  (1.93 m)   Wt 110 kg   SpO2 96%   BMI 29.52 kg/m   Physical Exam Vitals and nursing note reviewed.  Constitutional:      General: He is not in acute distress.    Appearance: He is well-developed. He is not diaphoretic.  HENT:     Head: Normocephalic and atraumatic.     Mouth/Throat:     Mouth: Mucous membranes are moist.     Pharynx: Oropharynx is clear.  Eyes:     Conjunctiva/sclera: Conjunctivae normal.  Cardiovascular:     Rate and Rhythm: Normal rate and regular rhythm.     Pulses: Normal pulses.          Radial pulses are 2+ on the right side and 2+ on the left side.  Pulmonary:     Effort: Pulmonary effort is normal. No respiratory distress.  Abdominal:     Palpations: Abdomen is soft.     Tenderness: There is no abdominal tenderness. There is no guarding.  Genitourinary:    Penis: Normal.      Comments: Genital Exam: Swelling and tenderness to the left testicle. Penis without swelling, lesions, or tenderness.  No noted erythema, swelling, or tenderness to the scrotum itself. No penile discharge.   Unable to fully assess for inguinal hernia through the scrotum due to patient's pain and tenderness.. No inguinal lymphadenopathy.  No tenderness or swelling to the perineum.  No crepitus. Otherwise normal male genitalia.   Musculoskeletal:     Cervical back: Neck supple.  Lymphadenopathy:     Cervical: No cervical adenopathy.  Skin:    General: Skin is warm and dry.  Neurological:     Mental Status: He is alert.  Psychiatric:        Mood and Affect: Mood and affect normal.        Speech: Speech normal.        Behavior: Behavior normal.     ED Results / Procedures / Treatments   Labs (all labs ordered are listed, but only abnormal results are displayed) Labs  Reviewed  BASIC METABOLIC PANEL - Abnormal; Notable for the following components:      Result Value   Sodium 133 (*)    Glucose, Bld 109 (*)    Calcium 8.8 (*)    All other components within normal limits  CBC WITH DIFFERENTIAL/PLATELET - Abnormal; Notable for the following components:   Monocytes Absolute 1.7 (*)    All other components within normal limits  URINALYSIS, ROUTINE W REFLEX MICROSCOPIC - Abnormal; Notable for the following components:   APPearance HAZY (*)    Hgb urine dipstick MODERATE (*)    Protein, ur 100 (*)    Leukocytes,Ua LARGE (*)    WBC, UA >50 (*)    Bacteria, UA RARE (*)    All other components within normal limits  URINE CULTURE  GC/CHLAMYDIA PROBE AMP () NOT AT [facility]    EKG None  Radiology US SCROTUM W/DOPPLER  Result Date: 12/07/2019 CLINICAL DATA:  Left testicular pain and swelling. EXAM: SCROTAL ULTRASOUND DOPPLER ULTRASOUND OF THE TESTICLES TECHNIQUE: Complete ultrasound examination of the testicles, epididymis, and other scrotal structures was performed. Color and spectral Doppler ultrasound were also utilized to evaluate blood flow to the testicles. COMPARISON:  April 17, 2015. FINDINGS: Right testicle Measurements: 5.1 x 3.1 x 3.8 cm. No mass or microlithiasis visualized. Left testicle Measurements: 3.6 x 3.4 x 2.0 cm. No mass or microlithiasis visualized. Appears somewhat heterogeneous and hypervascular. Right epididymis:  Normal in size and appearance. Left epididymis:  Enlarged and  hypervascular. Hydrocele:  Small right hydrocele is noted. Varicocele:  None visualized. Pulsed Doppler interrogation of both testes demonstrates normal low resistance arterial and venous waveforms bilaterally. IMPRESSION: Findings consistent with left-sided epididymo-orchitis. Small right hydrocele is noted. No definite evidence of testicular mass or torsion. Electronically Signed   By: Lupita Raider M.D.   On: 12/07/2019 11:53    Procedures Procedures (including critical care time)  Medications Ordered in ED Medications - No data to display  ED Course  I have reviewed the triage vital signs and the nursing notes.  Pertinent labs & imaging results that were available during my care of the patient were reviewed by me and considered in my medical decision making (see chart for details).  Clinical Course as of Dec 06 1521  Thu Dec 07, 2019  1029 Declines analgesia at this time.   [SJ]    Clinical Course User Index [SJ] Aliayah Tyer, Hillard Danker, PA-C   MDM Rules/Calculators/A&P                          Patient presents with testicular pain and swelling for the last 3 days. Patient is nontoxic appearing, afebrile, not tachycardic, not tachypneic, not hypotensive, maintains excellent SPO2 on room air, and is in no apparent distress.   I reviewed and interpreted the patient's labs and radiological studies. No leukocytosis.  Based on patient presentation, consideration of his vital signs and physical exam, my suspicion for alternative pathology, such as Fournier's gangrene, is low. Ultrasound results consistent with epididymitis orchitis, which is consistent with the patient's physical exam.  The patient was given instructions for home care as well as return precautions. Patient voices understanding of these instructions, accepts the plan, and is comfortable with discharge.  Vitals:   12/07/19 1019 12/07/19 1145 12/07/19 1310 12/07/19 1353  BP:  140/89 136/85 125/80  Pulse:  74 74 84  Resp:  18  16 17   Temp:  TempSrc:      SpO2:  98% 99% 98%  Weight: 110 kg     Height: 6\' 4"  (1.93 m)        Final Clinical Impression(s) / ED Diagnoses Final diagnoses:  Epididymoorchitis    Rx / DC Orders ED Discharge Orders         Ordered    levofloxacin (LEVAQUIN) 500 MG tablet  Daily        12/07/19 1323    ondansetron (ZOFRAN ODT) 4 MG disintegrating tablet  Every 8 hours PRN        12/07/19 1323    HYDROcodone-acetaminophen (NORCO/VICODIN) 5-325 MG tablet  Every 6 hours PRN        12/07/19 1323    ibuprofen (ADVIL) 600 MG tablet  3 times daily        12/07/19 1348           13/04/21, PA-C 12/07/19 1527    13/04/21, MD 12/08/19 2309

## 2019-12-07 NOTE — Discharge Instructions (Signed)
  Antiinflammatory medications: Take 600 mg of ibuprofen every 6 hours or 440 mg (over the counter dose) to 500 mg (prescription dose) of naproxen every 12 hours for the next 3 days. After this time, these medications may be used as needed for pain. Take these medications with food to avoid upset stomach. Choose only one of these medications, do not take them together. Acetaminophen (generic for Tylenol): Should you continue to have additional pain while taking the ibuprofen or naproxen, you may add in acetaminophen as needed. Your daily total maximum amount of acetaminophen from all sources should be limited to 4000mg /day for persons without liver problems, or 2000mg /day for those with liver problems. Vicodin: May take Vicodin (hydrocodone-acetaminophen) as needed for severe pain.   Do not drive or perform other dangerous activities while taking this medication as it can cause drowsiness as well as changes in reaction time and judgement.   Please note that each pill of Vicodin contains 325 mg of acetaminophen (generic for Tylenol) and the above dosage limits apply.  Please take all of your antibiotics until finished!   You may develop abdominal discomfort or diarrhea from the antibiotic.  You may help offset this with probiotics which you can buy or get in yogurt. Do not eat or take the probiotics until 2 hours after your antibiotic.   Follow-up: Follow-up with the urologist on this matter.  Call to make an appointment.  Return: Return to the emergency department for development of fever, worsening abdominal pain, difficulty urinating, difficulty having bowel movements, significantly increased pain, or any other major concerns.

## 2019-12-07 NOTE — ED Triage Notes (Addendum)
Pt reports left sided groin pain with swelling. Reports very tender. For 3 days Pt was seen yesterday and referred immediately to ED

## 2019-12-08 LAB — URINE CULTURE: Culture: NO GROWTH

## 2019-12-08 LAB — GC/CHLAMYDIA PROBE AMP (~~LOC~~) NOT AT ARMC
Chlamydia: NEGATIVE
Comment: NEGATIVE
Comment: NORMAL
Neisseria Gonorrhea: NEGATIVE

## 2019-12-13 ENCOUNTER — Ambulatory Visit: Payer: BC Managed Care – PPO | Admitting: Family Medicine

## 2019-12-14 ENCOUNTER — Encounter: Payer: Self-pay | Admitting: Family Medicine

## 2019-12-19 NOTE — Telephone Encounter (Signed)
Pt called stating that he is in a lot of pain and believes he has a hernia. Wants to be seen asap. Wants to know if Dr Darlyn Read will work him in to be seen?

## 2019-12-20 ENCOUNTER — Encounter: Payer: Self-pay | Admitting: Family Medicine

## 2019-12-20 ENCOUNTER — Ambulatory Visit: Payer: BC Managed Care – PPO | Admitting: Family Medicine

## 2019-12-20 ENCOUNTER — Other Ambulatory Visit: Payer: Self-pay

## 2019-12-20 ENCOUNTER — Ambulatory Visit (HOSPITAL_COMMUNITY)
Admission: RE | Admit: 2019-12-20 | Discharge: 2019-12-20 | Disposition: A | Discharge status: Home / Self Care | Payer: BC Managed Care – PPO | Source: Ambulatory Visit | Attending: Family Medicine | Admitting: Family Medicine

## 2019-12-20 VITALS — BP 129/87 | HR 74 | Temp 97.8°F | Ht 76.0 in | Wt 230.8 lb

## 2019-12-20 DIAGNOSIS — N453 Epididymo-orchitis: Secondary | ICD-10-CM | POA: Diagnosis not present

## 2019-12-20 DIAGNOSIS — N5082 Scrotal pain: Secondary | ICD-10-CM

## 2019-12-20 MED ORDER — CIPROFLOXACIN HCL 500 MG PO TABS: 500.0000 mg | ORAL_TABLET | Freq: Two times a day (BID) | ORAL | 0 refills | Status: DC

## 2019-12-20 MED ORDER — GADOBUTROL 1 MMOL/ML IV SOLN
10.0000 mL | Freq: Once | INTRAVENOUS | Status: AC | PRN
  Administered 2019-12-20: 10 mL via INTRAVENOUS

## 2019-12-20 MED ORDER — CEFTRIAXONE SODIUM 1 G IJ SOLR
1.0000 g | Freq: Once | INTRAMUSCULAR | Status: AC
  Administered 2019-12-20: 1 g via INTRAMUSCULAR

## 2019-12-20 NOTE — Progress Notes (Signed)
Subjective:  Patient ID: Matthew Thompson, male    DOB: January 09, 1966  Age: 54 y.o. MRN: [id number]  CC: Follow-up (ER )   HPI Matthew Thompson presents for worsening pain in the groin with daily swelling.  He went back to work about a week ago after treatment for his epididymal orchitis.  He had been to the ER for ultrasound that confirmed the infection.  He took all of his Levaquin.  He seemed to be getting better.  However, his swelling started back after a couple of hours at work and would go down at night and come back after couple hours the next day pain is getting worse as is the swelling.  It is located at the left inguinal region just lateral to the scrotum and radiates into the scrotum on the left.  Depression screen [facility] 2/9 12/20/2019 12/06/2019 11/13/2019  Decreased Interest 0 0 0  Down, Depressed, Hopeless 0 0 0  PHQ - 2 Score 0 0 0  Altered sleeping - - -  Tired, decreased energy - - -  Change in appetite - - -  Feeling bad or failure about yourself  - - -  Trouble concentrating - - -  Suicidal thoughts - - -  PHQ-9 Score - - -    History Bradley has a past medical history of Diabetes mellitus without complication (HCC) and Hypertension.   He has a past surgical history that includes left hand and Hernia repair.   His family history includes Cancer in his father and mother; Diabetes in his brother, father, mother, and sister; Heart disease in his mother; Hypertension in his brother, father, mother, and sister; Stroke in his mother.He reports that he has been smoking cigarettes. He has a 10.50 pack-year smoking history. He has never used smokeless tobacco. He reports current alcohol use. He reports that he does not use drugs.    ROS Review of Systems  Objective:  BP 129/87   Pulse 74   Temp 97.8 F (36.6 C) (Temporal)   Ht 6\' 4"  (1.93 m)   Wt 230 lb 12.8 oz (104.7 kg)   BMI 28.09 kg/m   BP Readings from Last 3 Encounters:  12/20/19 129/87  12/07/19 125/80   12/06/19 132/88    Wt Readings from Last 3 Encounters:  12/20/19 230 lb 12.8 oz (104.7 kg)  12/07/19 242 lb 8.1 oz (110 kg)  12/06/19 244 lb (110.7 kg)     Physical Exam Vitals reviewed.  Constitutional:      Appearance: Normal appearance. He is well-developed. He is ill-appearing. He is not toxic-appearing or diaphoretic.  HENT:     Head: Normocephalic and atraumatic.  Cardiovascular:     Heart sounds: No murmur heard.   Genitourinary:    Comments: The left testicle is swollen.  The ultrasound showed evidence of hydrocele that is apparent as well on exam.  There is no evidence for hernia on Valsalva.  There is marked tenderness.  There is no palpable femoral hernia on the left.  Circumcised penis without edema or lesion. Musculoskeletal:     Cervical back: Normal range of motion and neck supple.  Skin:    General: Skin is warm and dry.  Neurological:     Mental Status: He is alert and oriented to person, place, and time.       Assessment & Plan:   Matthew Thompson was seen today for follow-up.  Diagnoses and all orders for this visit:  Orchitis and epididymitis -  cefTRIAXone (ROCEPHIN) injection 1 g  Pain in scrotum -     MR Pelvis W Wo Contrast; Future  Other orders -     ciprofloxacin (CIPRO) 500 MG tablet; Take 1 tablet (500 mg total) by mouth 2 (two) times daily. For prostate. Take all of these.       I have discontinued Esther L. Robillard's levofloxacin. I am also having him start on ciprofloxacin. Additionally, I am having him maintain his fluticasone, atorvastatin, tizanidine, gabapentin, tadalafil, pregabalin, cetirizine, Janumet, ondansetron, and HYDROcodone-acetaminophen. We will continue to administer cefTRIAXone.  Allergies as of 12/20/2019   No Known Allergies     Medication List       Accurate as of December 20, 2019  8:37 AM. If you have any questions, ask your nurse or doctor.        STOP taking these medications   levofloxacin 500 MG  tablet Commonly known as: LEVAQUIN Stopped by: Mechele Claude, MD     TAKE these medications   atorvastatin 40 MG tablet Commonly known as: LIPITOR Take 1 tablet (40 mg total) by mouth daily. For cholesterol   cetirizine 10 MG tablet Commonly known as: ZYRTEC TAKE 1 TABLET (10 MG) BY MOUTH DAILY   ciprofloxacin 500 MG tablet Commonly known as: Cipro Take 1 tablet (500 mg total) by mouth 2 (two) times daily. For prostate. Take all of these. Started by: Mechele Claude, MD   fluticasone 50 MCG/ACT nasal spray Commonly known as: FLONASE Place 2 sprays into both nostrils daily.   gabapentin 300 MG capsule Commonly known as: NEURONTIN Take 5 capsules (1,500 mg total) by mouth 2 (two) times daily.   HYDROcodone-acetaminophen 5-325 MG tablet Commonly known as: NORCO/VICODIN Take 1-2 tablets by mouth every 6 (six) hours as needed for severe pain.   Janumet 50-1000 MG tablet Generic drug: sitaGLIPtin-metformin TAKE 1 TABLET BY MOUTH 2 (TWO) TIMES DAILY WITH A MEAL   ondansetron 4 MG disintegrating tablet Commonly known as: Zofran ODT Take 1 tablet (4 mg total) by mouth every 8 (eight) hours as needed for nausea or vomiting.   pregabalin 50 MG capsule Commonly known as: Lyrica 1 qhs X7 days , then 2 qhs X 7d, then 3 qhs X 7d, then 4 qhs   tadalafil 5 MG tablet Commonly known as: Cialis Take 1 tablet (5 mg total) by mouth daily.   tizanidine 6 MG capsule Commonly known as: ZANAFLEX Take 1 capsule (6 mg total) by mouth at bedtime. To relax muscles        Follow-up: Return in about 2 weeks (around 01/03/2020).  Mechele Claude, M.D.

## 2020-01-02 ENCOUNTER — Telehealth: Payer: Self-pay

## 2020-01-02 ENCOUNTER — Ambulatory Visit: Payer: BC Managed Care – PPO | Admitting: Family Medicine

## 2020-01-02 ENCOUNTER — Other Ambulatory Visit: Payer: Self-pay | Admitting: Family Medicine

## 2020-01-02 MED ORDER — DOXYCYCLINE HYCLATE 100 MG PO CAPS: 100.0000 mg | ORAL_CAPSULE | Freq: Two times a day (BID) | ORAL | 0 refills | Status: DC

## 2020-01-02 NOTE — Telephone Encounter (Signed)
I sent in doxycycline to replace the Cipro. It works as well. May cause nausea and must be taken on an empty stomach

## 2020-01-02 NOTE — Telephone Encounter (Signed)
Patient was seen on 12/20/2019 and prescribed Cipro 500 mg, one twice daily for 15 days.  Patient has been unable to get medication because it is on backorder at [facility].  Would like for something else to be sent in instead.

## 2020-01-02 NOTE — Telephone Encounter (Signed)
Aware that replacement antibiotic has been sent to pharmacy.

## 2020-01-03 ENCOUNTER — Telehealth: Payer: Self-pay

## 2020-01-03 MED ORDER — DOXYCYCLINE HYCLATE 100 MG PO CAPS
100.0000 mg | ORAL_CAPSULE | Freq: Two times a day (BID) | ORAL | 0 refills | Status: DC
Start: 2020-01-03 — End: 2020-07-24

## 2020-01-03 NOTE — Telephone Encounter (Signed)
Rx sent to correct pharmacy-patient aware. 

## 2020-01-04 ENCOUNTER — Other Ambulatory Visit: Payer: Self-pay | Admitting: Family Medicine

## 2020-02-07 ENCOUNTER — Ambulatory Visit: Payer: BC Managed Care – PPO | Admitting: Family Medicine

## 2020-02-12 ENCOUNTER — Encounter: Payer: Self-pay | Admitting: Family Medicine

## 2020-07-15 ENCOUNTER — Encounter: Payer: Self-pay | Admitting: Family Medicine

## 2020-07-17 ENCOUNTER — Other Ambulatory Visit: Payer: Self-pay

## 2020-07-17 ENCOUNTER — Ambulatory Visit: Payer: BC Managed Care – PPO | Admitting: Family Medicine

## 2020-07-17 ENCOUNTER — Encounter: Payer: Self-pay | Admitting: Family Medicine

## 2020-07-17 VITALS — BP 156/83 | HR 83 | Temp 98.6°F | Ht 76.0 in | Wt 243.0 lb

## 2020-07-17 DIAGNOSIS — M5416 Radiculopathy, lumbar region: Secondary | ICD-10-CM | POA: Diagnosis not present

## 2020-07-17 MED ORDER — PREDNISONE 10 MG PO TABS: ORAL_TABLET | ORAL | 0 refills | Status: DC

## 2020-07-17 MED ORDER — HYDROCODONE-ACETAMINOPHEN 10-325 MG PO TABS
1.0000 | ORAL_TABLET | Freq: Four times a day (QID) | ORAL | 0 refills | Status: AC | PRN
Start: 2020-07-17 — End: 2020-07-22

## 2020-07-17 NOTE — Patient Instructions (Signed)
Back Exercises The following exercises strengthen the muscles that help to support the trunk and back. They also help to keep the lower back flexible. Doing these exercises can help to prevent back pain or lessen existing pain. If you have back pain or discomfort, try doing these exercises 2-3 times each day or as told by your health care provider. As your pain improves, do them once each day, but increase the number of times that you repeat the steps for each exercise (do more repetitions). To prevent the recurrence of back pain, continue to do these exercises once each day or as told by your health care provider. Do exercises exactly as told by your health care provider and adjust them as directed. It is normal to feel mild stretching, pulling, tightness, or discomfort as you do these exercises, but you should stop right away if youfeel sudden pain or your pain gets worse. Exercises Single knee to chest Repeat these steps 3-5 times for each leg: Lie on your back on a firm bed or the floor with your legs extended. Bring one knee to your chest. Your other leg should stay extended and in contact with the floor. Hold your knee in place by grabbing your knee or thigh with both hands and hold. Pull on your knee until you feel a gentle stretch in your lower back or buttocks. Hold the stretch for 10-30 seconds. Slowly release and straighten your leg. Pelvic tilt Repeat these steps 5-10 times: Lie on your back on a firm bed or the floor with your legs extended. Bend your knees so they are pointing toward the ceiling and your feet are flat on the floor. Tighten your lower abdominal muscles to press your lower back against the floor. This motion will tilt your pelvis so your tailbone points up toward the ceiling instead of pointing to your feet or the floor. With gentle tension and even breathing, hold this position for 5-10 seconds. Cat-cow Repeat these steps until your lower back becomes more  flexible: Get into a hands-and-knees position on a firm surface. Keep your hands under your shoulders, and keep your knees under your hips. You may place padding under your knees for comfort. Let your head hang down toward your chest. Contract your abdominal muscles and point your tailbone toward the floor so your lower back becomes rounded like the back of a cat. Hold this position for 5 seconds. Slowly lift your head, let your abdominal muscles relax and point your tailbone up toward the ceiling so your back forms a sagging arch like the back of a cow. Hold this position for 5 seconds.  Press-ups Repeat these steps 5-10 times: Lie on your abdomen (face-down) on the floor. Place your palms near your head, about shoulder-width apart. Keeping your back as relaxed as possible and keeping your hips on the floor, slowly straighten your arms to raise the top half of your body and lift your shoulders. Do not use your back muscles to raise your upper torso. You may adjust the placement of your hands to make yourself more comfortable. Hold this position for 5 seconds while you keep your back relaxed. Slowly return to lying flat on the floor.  Bridges Repeat these steps 10 times: Lie on your back on a firm surface. Bend your knees so they are pointing toward the ceiling and your feet are flat on the floor. Your arms should be flat at your sides, next to your body. Tighten your buttocks muscles and lift your   buttocks off the floor until your waist is at almost the same height as your knees. You should feel the muscles working in your buttocks and the back of your thighs. If you do not feel these muscles, slide your feet 1-2 inches farther away from your buttocks. Hold this position for 3-5 seconds. Slowly lower your hips to the starting position, and allow your buttocks muscles to relax completely. If this exercise is too easy, try doing it with your arms crossed over yourchest. Abdominal  crunches Repeat these steps 5-10 times: Lie on your back on a firm bed or the floor with your legs extended. Bend your knees so they are pointing toward the ceiling and your feet are flat on the floor. Cross your arms over your chest. Tip your chin slightly toward your chest without bending your neck. Tighten your abdominal muscles and slowly raise your trunk (torso) high enough to lift your shoulder blades a tiny bit off the floor. Avoid raising your torso higher than that because it can put too much stress on your low back and does not help to strengthen your abdominal muscles. Slowly return to your starting position. Back lifts Repeat these steps 5-10 times: Lie on your abdomen (face-down) with your arms at your sides, and rest your forehead on the floor. Tighten the muscles in your legs and your buttocks. Slowly lift your chest off the floor while you keep your hips pressed to the floor. Keep the back of your head in line with the curve in your back. Your eyes should be looking at the floor. Hold this position for 3-5 seconds. Slowly return to your starting position. Contact a health care provider if: Your back pain or discomfort gets much worse when you do an exercise. Your worsening back pain or discomfort does not lessen within 2 hours after you exercise. If you have any of these problems, stop doing these exercises right away. Do not do them again unless your health care provider says that you can. Get help right away if: You develop sudden, severe back pain. If this happens, stop doing the exercises right away. Do not do them again unless your health care provider says that you can. This information is not intended to replace advice given to you by your health care provider. Make sure you discuss any questions you have with your healthcare provider. Document Revised: 05/26/2018 Document Reviewed: 10/21/2017 [employer]vier Patient Education  2022 [employer]  

## 2020-07-17 NOTE — Progress Notes (Signed)
Subjective:  Patient ID: Matthew Thompson,  male    DOB: 06/23/1965  Age: 55 y.o.    CC: Back Pain (Right lower)   HPI Matthew Thompson presents for  acute injury to lower back. Pt. 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 It was very difficult for him and the pain developed immediately. Within three days the RLE started going numb. Now uncomfortable for all positions. Can only sleep lying on the right side. 8/10 low back pain and 10/10 going down posterior RLE to foot. Foot is numb. Currently using ibuprofen and muscle relaxer with no relief. Swelling noted at right lower back at onset.     History Matthew Thompson has a past medical history of Diabetes mellitus without complication (HCC) and Hypertension.   He has a past surgical history that includes left hand and Hernia repair.   His family history includes Cancer in his father and mother; Diabetes in his brother, father, mother, and sister; Heart disease in his mother; Hypertension in his brother, father, mother, and sister; Stroke in his mother.He reports that he has been smoking cigarettes. He has a 10.50 pack-year smoking history. He has never used smokeless tobacco. He reports current alcohol use. He reports that he does not use drugs.  Current Outpatient Medications on File Prior to Visit  Medication Sig Dispense Refill   gabapentin (NEURONTIN) 300 MG capsule Take 5 capsules (1,500 mg total) by mouth 2 (two) times daily. 900 capsule 1   JANUMET 50-1000 MG tablet TAKE 1 TABLET BY MOUTH 2 (TWO) TIMES DAILY WITH A MEAL 60 tablet 0   atorvastatin (LIPITOR) 40 MG tablet Take 1 tablet (40 mg total) by mouth daily. For cholesterol (Patient not taking: Reported on 07/17/2020) 90 tablet 3   cetirizine (ZYRTEC) 10 MG tablet TAKE 1 TABLET (10 MG) BY MOUTH DAILY (Patient not taking: Reported on 07/17/2020) 90 tablet 1   doxycycline (VIBRAMYCIN) 100 MG capsule Take 1 capsule (100 mg  total) by mouth 2 (two) times daily. (Patient not taking: Reported on 07/17/2020) 30 capsule 0   fluticasone (FLONASE) 50 MCG/ACT nasal spray Place 2 sprays into both nostrils daily. (Patient not taking: Reported on 07/17/2020) 16 g 6   ondansetron (ZOFRAN ODT) 4 MG disintegrating tablet Take 1 tablet (4 mg total) by mouth every 8 (eight) hours as needed for nausea or vomiting. (Patient not taking: Reported on 07/17/2020) 20 tablet 0   pregabalin (LYRICA) 50 MG capsule 1 qhs X7 days , then 2 qhs X 7d, then 3 qhs X 7d, then 4 qhs (Patient not taking: Reported on 07/17/2020) 120 capsule 1   tadalafil (CIALIS) 5 MG tablet Take 1 tablet (5 mg total) by mouth daily. (Patient not taking: Reported on 07/17/2020) 90 tablet 1   tizanidine (ZANAFLEX) 6 MG capsule Take 1 capsule (6 mg total) by mouth at bedtime. To relax muscles (Patient not taking: Reported on 07/17/2020) 30 capsule 2   No current facility-administered medications on file prior to visit.    ROS Review of Systems  Constitutional:  Negative for chills, diaphoresis and fever.  HENT:  Negative for sore throat.   Respiratory:  Negative for shortness of breath.   Cardiovascular:  Negative for chest pain.  Gastrointestinal:  Negative for abdominal pain.  Musculoskeletal:  Positive for arthralgias, back pain, gait problem and myalgias. Negative for neck pain.  Skin:  Negative for rash.  Neurological:  Positive  for weakness. Negative for numbness.   Objective:  BP (!) 156/83   Pulse 83   Temp 98.6 F (37 C)   Ht 6\' 4"  (1.93 m)   Wt 243 lb (110.2 kg)   SpO2 97%   BMI 29.58 kg/m   BP Readings from Last 3 Encounters:  07/17/20 (!) 156/83  12/20/19 129/87  12/07/19 125/80    Wt Readings from Last 3 Encounters:  07/17/20 243 lb (110.2 kg)  12/20/19 230 lb 12.8 oz (104.7 kg)  12/07/19 242 lb 8.1 oz (110 kg)     Physical Exam Vitals reviewed.  Constitutional:      General: He is in acute distress.     Appearance: He is  well-developed.  HENT:     Head: Normocephalic.  Eyes:     Pupils: Pupils are equal, round, and reactive to light.  Cardiovascular:     Rate and Rhythm: Normal rate and regular rhythm.     Heart sounds: Normal heart sounds. No murmur heard. Pulmonary:     Effort: Pulmonary effort is normal.     Breath sounds: Normal breath sounds.  Abdominal:     Tenderness: There is no abdominal tenderness.  Musculoskeletal:        General: Tenderness (painful with RSLR at 20 degrees.) present.     Cervical back: Normal range of motion.  Skin:    General: Skin is warm and dry.  Neurological:     Mental Status: He is alert and oriented to person, place, and time.     Deep Tendon Reflexes: Reflexes are normal and symmetric.  Psychiatric:        Behavior: Behavior normal.        Thought Content: Thought content normal.    Diabetic Foot Exam - Simple   No data filed       Assessment & Plan:   Matthew Thompson was seen today for back pain.  Diagnoses and all orders for this visit:  Right lumbar radiculopathy -     MR LUMBAR SPINE WO CONTRAST; Future  Other orders -     predniSONE (DELTASONE) 10 MG tablet; Take 5 daily for 3 days followed by 4,3,2 and 1 for 3 days each. -     HYDROcodone-acetaminophen (NORCO) 10-325 MG tablet; Take 1 tablet by mouth every 6 (six) hours as needed for up to 5 days for moderate pain or severe pain.  I have discontinued Antario L. Sulton's HYDROcodone-acetaminophen. I am also having him start on predniSONE and HYDROcodone-acetaminophen. Additionally, I am having him maintain his fluticasone, atorvastatin, tizanidine, gabapentin, tadalafil, pregabalin, cetirizine, ondansetron, doxycycline, and Janumet.  Meds ordered this encounter  Medications   predniSONE (DELTASONE) 10 MG tablet    Sig: Take 5 daily for 3 days followed by 4,3,2 and 1 for 3 days each.    Dispense:  45 tablet    Refill:  0   HYDROcodone-acetaminophen (NORCO) 10-325 MG tablet    Sig: Take 1 tablet by  mouth every 6 (six) hours as needed for up to 5 days for moderate pain or severe pain.    Dispense:  20 tablet    Refill:  0     Follow-up: Return in about 1 week (around 07/24/2020).  07/26/2020, M.D.

## 2020-07-22 ENCOUNTER — Ambulatory Visit (HOSPITAL_COMMUNITY): Admission: RE | Admit: 2020-07-22 | Payer: BC Managed Care – PPO | Source: Ambulatory Visit

## 2020-07-24 ENCOUNTER — Ambulatory Visit: Payer: BC Managed Care – PPO | Admitting: Family Medicine

## 2020-07-24 ENCOUNTER — Encounter: Payer: Self-pay | Admitting: Family Medicine

## 2020-07-24 ENCOUNTER — Other Ambulatory Visit: Payer: Self-pay

## 2020-07-24 VITALS — BP 144/80 | HR 72 | Temp 98.4°F | Ht 76.0 in | Wt 240.8 lb

## 2020-07-24 DIAGNOSIS — R200 Anesthesia of skin: Secondary | ICD-10-CM | POA: Diagnosis not present

## 2020-07-24 DIAGNOSIS — M5416 Radiculopathy, lumbar region: Secondary | ICD-10-CM

## 2020-07-24 MED ORDER — GABAPENTIN 300 MG PO CAPS
1500.0000 mg | ORAL_CAPSULE | Freq: Two times a day (BID) | ORAL | 1 refills | Status: DC
Start: 2020-07-24 — End: 2020-10-23

## 2020-07-24 NOTE — Progress Notes (Signed)
Subjective:  Patient ID: Matthew Thompson, male    DOB: 12/15/1965  Age: 55 y.o. MRN: [id number]  CC: Follow-up (Back pain/)   HPI Matthew Thompson presents for continued pain in lower back pain near the SI on the right. RLE is numb. Right foot has been swelling intermittently. Pain is a 4/10. 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 Wants to avoid pain killers going forward.  Depression screen [facility] 2/9 07/24/2020 07/17/2020 12/20/2019  Decreased Interest 0 0 0  Down, Depressed, Hopeless 0 0 0  PHQ - 2 Score 0 0 0  Altered sleeping - - -  Tired, decreased energy - - -  Change in appetite - - -  Feeling bad or failure about yourself  - - -  Trouble concentrating - - -  Suicidal thoughts - - -  PHQ-9 Score - - -    History Matthew Thompson has a past medical history of Diabetes mellitus without complication (HCC) and Hypertension.   He has a past surgical history that includes left hand and Hernia repair.   His family history includes Cancer in his father and mother; Diabetes in his brother, father, mother, and sister; Heart disease in his mother; Hypertension in his brother, father, mother, and sister; Stroke in his mother.He reports that he has been smoking cigarettes. He has a 10.50 pack-year smoking history. He has never used smokeless tobacco. He reports current alcohol use. He reports that he does not use drugs.    ROS Review of Systems  Constitutional:  Negative for chills, diaphoresis and fever.  HENT:  Negative for sore throat.   Respiratory:  Negative for shortness of breath.   Cardiovascular:  Negative for chest pain.  Gastrointestinal:  Negative for abdominal pain.  Musculoskeletal:  Positive for arthralgias, back pain, gait problem and myalgias. Negative for neck pain.  Skin:  Negative for rash.  Neurological:  Positive for weakness. Negative for numbness.   Objective:  BP (!) 144/80   Pulse 72   Temp 98.4 F (36.9 C)   Ht 6\' 4"  (1.93 m)   Wt 240 lb 12.8 oz (109.2 kg)   SpO2 98%    BMI 29.31 kg/m   BP Readings from Last 3 Encounters:  07/24/20 (!) 144/80  07/17/20 (!) 156/83  12/20/19 129/87    Wt Readings from Last 3 Encounters:  07/24/20 240 lb 12.8 oz (109.2 kg)  07/17/20 243 lb (110.2 kg)  12/20/19 230 lb 12.8 oz (104.7 kg)     Physical Exam Vitals reviewed.  Constitutional:      General: He is in acute distress.     Appearance: He is well-developed.  HENT:     Head: Normocephalic.  Eyes:     Pupils: Pupils are equal, round, and reactive to light.  Cardiovascular:     Rate and Rhythm: Normal rate and regular rhythm.     Heart sounds: Normal heart sounds. No murmur heard. Pulmonary:     Effort: Pulmonary effort is normal.     Breath sounds: Normal breath sounds.  Abdominal:     Tenderness: There is no abdominal tenderness.  Musculoskeletal:        General: Tenderness present.     Cervical back: Normal range of motion.  Skin:    General: Skin is warm and dry.  Neurological:     Mental Status: He is alert and oriented to person, place, and time.     Deep Tendon Reflexes: Reflexes are normal and symmetric.  Psychiatric:  Behavior: Behavior normal.        Thought Content: Thought content normal.      Assessment & Plan:   Matthew Thompson was seen today for follow-up.  Diagnoses and all orders for this visit:  Right lumbar radiculopathy -     Ambulatory referral to Neurology -     Ambulatory referral to Physical Therapy  Numbness  Other orders -     gabapentin (NEURONTIN) 300 MG capsule; Take 5 capsules (1,500 mg total) by mouth 2 (two) times daily.      I have discontinued Harvard L. Malinoski's fluticasone, atorvastatin, tizanidine, tadalafil, pregabalin, cetirizine, ondansetron, and doxycycline. I am also having him maintain his Janumet, predniSONE, and gabapentin.  Allergies as of 07/24/2020   No Known Allergies      Medication List        Accurate as of July 24, 2020 11:47 AM. If you have any questions, ask your nurse or  doctor.          STOP taking these medications    atorvastatin 40 MG tablet Commonly known as: LIPITOR Stopped by: Mechele Claude, MD   cetirizine 10 MG tablet Commonly known as: ZYRTEC Stopped by: Mechele Claude, MD   doxycycline 100 MG capsule Commonly known as: Vibramycin Stopped by: Mechele Claude, MD   fluticasone 50 MCG/ACT nasal spray Commonly known as: FLONASE Stopped by: Mechele Claude, MD   ondansetron 4 MG disintegrating tablet Commonly known as: Zofran ODT Stopped by: Mechele Claude, MD   pregabalin 50 MG capsule Commonly known as: Lyrica Stopped by: Mechele Claude, MD   tadalafil 5 MG tablet Commonly known as: Cialis Stopped by: Mechele Claude, MD   tizanidine 6 MG capsule Commonly known as: ZANAFLEX Stopped by: Mechele Claude, MD       TAKE these medications    gabapentin 300 MG capsule Commonly known as: NEURONTIN Take 5 capsules (1,500 mg total) by mouth 2 (two) times daily.   Janumet 50-1000 MG tablet Generic drug: sitaGLIPtin-metformin TAKE 1 TABLET BY MOUTH 2 (TWO) TIMES DAILY WITH A MEAL   predniSONE 10 MG tablet Commonly known as: DELTASONE Take 5 daily for 3 days followed by 4,3,2 and 1 for 3 days each.         Follow-up: Return in about 2 weeks (around 08/07/2020) for lumbar injury.  Mechele Claude, M.D.

## 2020-07-30 ENCOUNTER — Encounter: Payer: Self-pay | Admitting: Neurology

## 2020-07-30 ENCOUNTER — Ambulatory Visit: Payer: BC Managed Care – PPO | Admitting: Neurology

## 2020-07-30 VITALS — BP 160/77 | HR 70 | Ht 76.0 in | Wt 244.0 lb

## 2020-07-30 DIAGNOSIS — R292 Abnormal reflex: Secondary | ICD-10-CM | POA: Diagnosis not present

## 2020-07-30 DIAGNOSIS — R32 Unspecified urinary incontinence: Secondary | ICD-10-CM | POA: Diagnosis not present

## 2020-07-30 DIAGNOSIS — R200 Anesthesia of skin: Secondary | ICD-10-CM | POA: Diagnosis not present

## 2020-07-30 NOTE — Progress Notes (Signed)
[facility]  PATIENT: Matthew Thompson DOB: [birth year]  REFERRING DOCTOR OR PCP: Mechele ClaudeWarren Stacks MD SOURCE: Patient, notes from Dr. Darlyn ReadStacks, imaging reports, MRI images from last year were personally reviewed.  _________________________________   HISTORICAL  CHIEF COMPLAINT:  Chief Complaint  Patient presents with   New Patient (Initial Visit)    RM 12, alone. Internal referral from Mechele ClaudeWarren Stacks, MD for right lumbar radiculopathy. Sx started about 6 years ago. Numbness started about 3 weeks ago. Work had him move a total of 23,000 lb off of pallets and it occurred after 7th pallet. They had him keep going. Been out of work for 3 weeks. On workman's comp. They want him to do PT first. Works for [employer]. Pain located in lower back that is intermittent. Numbness located in right leg and is constant. Leg feels ice cold.   Medication Management    Does not want medication, wants "problem fixed"    HISTORY OF PRESENT ILLNESS:  I had the pleasure of seeing patient, Matthew RhineCalvin Thompson[facility] neurologic consultation regarding his back pain and right leg numbness.  He is a 55 year old man who has had some back pain x 6 years.  In general, pain would be in the back and he had not previously experienced numbness in the legs.  Symptoms worsened 3 weeks ago after he had to move some pallets at work.    He notes the worse pain in the back and he does not note any pain in the legs.  However, he has numbness in the right lower leg to the top of the right foot and into the great toe.   Sensation is normal in the lateral foot.  He notes slight weakness in the right foot.   The left leg and foot are fine.   He saw Dr. Darlyn ReadStacks and was prescribed a medication for pain.  He is on [employer].   He had an MRI 07/23/2020 (repor below).    It showed multilevel DJD with mild to moderate spinal stenosis at L3-L4 and L4-L5.  He also had moderate  foraminal narrowing that could affect the L5 nerve roots.  I do not have access to those MRI images (they have been requested).  However, I was able to MRI of the lumbar spine from 09/20/2019.  At L2-L3, there is mild spinal stenosis due to increased epidural fat and minimal disc bulging.  At L3-L4 and L4-L5, there is moderate spinal stenosis due  toepidural fat and also to disc bulging.  At L5-S1, there is reduced disc height, borderline retrolisthesis, disc bulging, endplate spurring.  These combine to cause moderate foraminal narrowing but no definite nerve root compression.  In the past, he had a series of epidural steroid injections without benefit.  He has been prescribed gabapentin but it has not been beneficial..   Besides the back pain and numbness, he has had urinary changes over the last 3 weeks.  Specifically, he has had 6-7 episodes of urianry incontinence since symptoms started 3 weeks ago.  He has not had incontinence in the past..  MRI images: MRI of the lumbar spine from 09/20/2019 was personally reviewed..  At L2-L3, there is mild spinal stenosis due to increased epidural fat and minimal disc bulging.  At L3-L4 and L4-L5, there is moderate spinal stenosis due  to epidural fat and also to disc bulging.  At L5-S1, there is reduced disc height, borderline retrolisthesis, disc bulging, endplate spurring.  These combine to cause moderate foraminal narrowing, left greater than right but no definite nerve root compression.  MRI of the lumbar spine 07/23/2020 (report only) 1.  Multilevel degenerative changes of the lumbar spine as detailed above without advanced canal or foraminal stenosis.  2.  Mild to moderate degrees of canal stenosis greatest at L3-4 and L4-5 secondary to degenerative changes and exacerbated by prominent dorsal epidural fat at L3-L5, as detailed above.  3.  Moderate foraminal stenosis bilaterally at L5-S1 with contact and deformityof the bilateral exiting L5 nerve  roots.   REVIEW OF SYSTEMS: Constitutional: No fevers, chills, sweats, or change in appetite Eyes: No visual changes, double vision, eye pain Ear, nose and throat: No hearing loss, ear pain, nasal congestion, sore throat Cardiovascular: No chest pain, palpitations Respiratory:  No shortness of breath at rest or with exertion.   No wheezes GastrointestinaI: No nausea, vomiting, diarrhea, abdominal pain, fecal incontinence Genitourinary:  No dysuria, urinary retention or frequency.  No nocturia. Musculoskeletal:  No neck pain, back pain Integumentary: No rash, pruritus, skin lesions Neurological: as above Psychiatric: No depression at this time.  No anxiety Endocrine: No palpitations, diaphoresis, change in appetite, change in weigh or increased thirst Hematologic/Lymphatic:  No anemia, purpura, petechiae. Allergic/Immunologic: No itchy/runny eyes, nasal congestion, recent allergic reactions, rashes  ALLERGIES: No Known Allergies  HOME MEDICATIONS:  Current Outpatient Medications:    gabapentin (NEURONTIN) 300 MG capsule, Take 5 capsules (1,500 mg total) by mouth 2 (two) times daily., Disp: 900 capsule, Rfl: 1   JANUMET 50-1000 MG tablet, TAKE 1 TABLET BY MOUTH 2 (TWO) TIMES DAILY WITH A MEAL, Disp: 60 tablet, Rfl: 0  PAST MEDICAL HISTORY: Past Medical History:  Diagnosis Date   Diabetes mellitus without complication (HCC)    Hypertension     PAST SURGICAL HISTORY: Past Surgical History:  Procedure Laterality Date   HERNIA REPAIR     left hand      FAMILY HISTORY: Family History  Problem Relation Age of Onset   Stroke Mother    Heart disease Mother    Diabetes Mother    Hypertension Mother    Cancer Mother    Hypertension Father    Diabetes Father    Cancer Father    Diabetes Sister    Hypertension Sister    Diabetes Brother    Hypertension Brother     SOCIAL HISTORY:  Social History   Socioeconomic History   Marital status: Married    Spouse name: Ithiel Liebler   Number of children: 4   Years of education: 12   Highest education level: Not on file  Occupational History   Not on file  Tobacco Use   Smoking status: Every Day    Packs/day: 0.50    Years: 21.00    Pack years: 10.50    Types: Cigarettes   Smokeless tobacco: Never  Vaping Use   Vaping Use: Never used  Substance and Sexual Activity   Alcohol use: Yes    Comment: occassionally   Drug use: No   Sexual activity: Yes  Other Topics Concern   Not on file  Social History Narrative   Right handed   2-3 cups coffee per day   Soda daily   Social Determinants of Health   Financial Resource Strain: Not on file  Food Insecurity: Not on file  Transportation Needs: Not on file  Physical Activity: Not on file  Stress: Not on file  Social Connections: Not on file  Intimate  Partner Violence: Not on file     PHYSICAL EXAM  Vitals:   07/30/20 1500  BP: (!) 160/77  Pulse: 70  Weight: 244 lb (110.7 kg)  Height: 6\' 4"  (1.93 m)    Body mass index is 29.7 kg/m.   General: The patient is well-developed and well-nourished and in no acute distress  HEENT:  Head is [place]/AT.  Sclera are anicteric.    Neck: No carotid bruits are noted.  The neck is nontender.  Cardiovascular: The heart has a regular rate and rhythm with a normal S1 and S2. There were no murmurs, gallops or rubs.    Skin: Extremities are without rash or edema.  Musculoskeletal:  Back is nontender  Neurologic Exam  Mental status: The patient is alert and oriented x 3 at the time of the examination. The patient has apparent normal recent and remote memory, with an apparently normal attention span and concentration ability.   Speech is normal.  Cranial nerves: Extraocular movements are full. Pupils are equal, round, and reactive to light and accomodation.  Facial strength and sensation were normal.. No obvious hearing deficits are noted.  Motor:  Muscle bulk is normal.   Tone is normal. Strength is  5 / 5  in all 4 extremities except 4+/5 strength in the toe extensors on the right.  Sensory: Sensory testing is intact to pinprick, soft touch and vibration sensation in the arms of the left leg.  He has reduced sensation below the right knee medially towards the dorsum of the foot and into the first few toes.  Sensation was normal bilaterally.  Coordination: Cerebellar testing reveals good finger-nose-finger and heel-to-shin bilaterally.  Gait and station: Station is normal.   Gait is arthritic. Tandem gait is wide. Romberg is negative.   Reflexes: Deep tendon reflexes are symmetric and normal in the arms.  He has increased 3+ reflexes at the knees with crossed abductor responses.  DTRs were 3 symmetric at the ankles.   Plantar responses are flexor.    DIAGNOSTIC DATA (LABS, IMAGING, TESTING) - I reviewed patient records, labs, notes, testing and imaging myself where available.  Lab Results  Component Value Date   WBC 10.5 12/07/2019   HGB 13.6 12/07/2019   HCT 40.7 12/07/2019   MCV 95.5 12/07/2019   PLT 293 12/07/2019      Component Value Date/Time   NA 133 (L) 12/07/2019 1250   NA 139 11/13/2019 0815   K 3.8 12/07/2019 1250   CL 101 12/07/2019 1250   CO2 24 12/07/2019 1250   GLUCOSE 109 (H) 12/07/2019 1250   BUN 8 12/07/2019 1250   BUN 11 11/13/2019 0815   CREATININE 0.82 12/07/2019 1250   CALCIUM 8.8 (L) 12/07/2019 1250   PROT 7.2 11/13/2019 0815   ALBUMIN 4.6 11/13/2019 0815   AST 20 11/13/2019 0815   ALT 31 11/13/2019 0815   ALKPHOS 114 11/13/2019 0815   BILITOT 0.5 11/13/2019 0815   GFRNONAA >60 12/07/2019 1250   GFRAA 102 11/13/2019 0815   Lab Results  Component Value Date   CHOL 105 11/13/2019   HDL 34 (L) 11/13/2019   LDLCALC 46 11/13/2019   TRIG 142 11/13/2019   CHOLHDL 3.1 11/13/2019   Lab Results  Component Value Date   HGBA1C 8.3 (H) 11/13/2019       ASSESSMENT AND PLAN  Numbness - Plan: MR CERVICAL SPINE WO CONTRAST, MR THORACIC SPINE WO  CONTRAST  Urinary incontinence, unspecified type - Plan: MR CERVICAL SPINE WO CONTRAST,  MR THORACIC SPINE WO CONTRAST  Hyperreflexia - Plan: MR CERVICAL SPINE WO CONTRAST, MR THORACIC SPINE WO CONTRAST  In summary, Mr. Borsuk is a 55 year old man who had the onset of worsened back pain and the new onset of right leg numbness and urinary incontinence that began after he moved heavy pallets at work.  On exam, he has hyperreflexia in the legs with a pathologic knee reflex.  Additionally, he has numbness in the medial lower right leg to the dorsum of the foot and the great toe.  The distribution of the numbness could be consistent with either a peroneal neuropathy or an L5 radiculopathy.  The radiologic interpretation of his recent study seems to be similar to what I could see on the MRI from last year and he does have foraminal narrowing at L5-S1 that could affect the L5 nerve root, especially on the left.  Unfortunately, since his Workmen's Comp. referred him to a different imaging center for the more recent MRI, no comparison was done.  I have tried to get the actual images so I can do a side-by-side comparison.   The lumbar degenerative changes could easily explain back pain but could not explain the hyperreflexia or the urinary changes.  Due to the hyperreflexia in the new onset urinary incontinence, I am more concerned about the possibility of a cervical or thoracic myelopathy than of some worsening of the lumbar degenerative changes.    Therefore, we need to check an MRI of the cervical and thoracic spine.    If there is evidence of myelopathy I will refer him to neurosurgery.  If there is no evidence of myelopathy, then I would recommend he try an epidural steroid injection for the back pain and right leg symptoms.  Due to the moderate spinal stenosis, it an ESI is not of help, consider referral to neurosurgery.  We will let him know the results of the study.  He should call if he has any major  worsening or new symptoms.  Thank you for asking me to see Mr. Ressel.  Please let me know if I can be of further assistance with him or other patients in the future.    Bueford Arp A. Epimenio Foot, MD, [employer] 07/30/2020, 3:43 PM Certified in Neurology, Clinical Neurophysiology, Sleep Medicine and Neuroimaging  [facility] [address], [address] [place], [place] [address] [phone]

## 2020-08-06 ENCOUNTER — Telehealth: Payer: Self-pay | Admitting: *Deleted

## 2020-08-06 NOTE — Telephone Encounter (Signed)
2 request to [phone]

## 2020-08-19 ENCOUNTER — Encounter: Payer: Self-pay | Admitting: Family Medicine

## 2020-08-19 ENCOUNTER — Ambulatory Visit: Payer: BC Managed Care – PPO | Admitting: Family Medicine

## 2020-09-25 ENCOUNTER — Telehealth: Payer: Self-pay | Admitting: Neurology

## 2020-09-25 NOTE — Telephone Encounter (Signed)
Pt called stating his symptoms are worsening and wanting to schedule an appt. Pt requesting a call back.

## 2020-09-25 NOTE — Telephone Encounter (Signed)
Called and spoke w/ pt. He has been having worsening sx since last seen. On workman's comp. They wanted him to see their specific doctors. He did this and MRI lumar was ordered. He never had MRI cervical/thoracic that Dr. Epimenio Foot ordered d/t then denying these.  Doctor he is following currently ordered ESI which he completed 3 wks ago. He is diabetic, made BS elevated. Gave muscle relaxer's, ineffective. Saw a doctor by the name of Victorino Dike 09/24/20. Read MRI lumbar done via [facility]. Told him he had "torn ligament and leakage". He has never been told this before.  They are trying to deny workmans' comp. Told him he can follow up w/ Dr. Epimenio Foot and do any further testing he would recommend at this point, insurance would cover. Does not want any more ESI. Scheduled appt for 10/21/20 at 2:00pm with Dr. Epimenio Foot.  Asked him to get copy of MRI lumbar CD to bring with him to appt.

## 2020-10-04 ENCOUNTER — Ambulatory Visit (INDEPENDENT_AMBULATORY_CARE_PROVIDER_SITE_OTHER): Payer: BC Managed Care – PPO | Admitting: Family Medicine

## 2020-10-04 ENCOUNTER — Encounter: Payer: Self-pay | Admitting: Family Medicine

## 2020-10-04 DIAGNOSIS — M5416 Radiculopathy, lumbar region: Secondary | ICD-10-CM

## 2020-10-04 MED ORDER — PREDNISONE 20 MG PO TABS: ORAL_TABLET | ORAL | 0 refills | Status: DC

## 2020-10-04 NOTE — Telephone Encounter (Signed)
Appt made for today

## 2020-10-04 NOTE — Progress Notes (Signed)
   Virtual Visit via telephone Note  I connected with Matthew Thompson on 10/04/20 at 1445 by telephone and verified that I am speaking with the correct person using two identifiers. Matthew Thompson is currently located at home and patient are currently with her during visit. The provider, Elige Radon Katja Blue, MD is located in their office at time of visit.  Call ended at 1457  I discussed the limitations, risks, security and privacy concerns of performing an evaluation and management service by telephone and the availability of in person appointments. I also discussed with the patient that there may be a patient responsible charge related to this service. The patient expressed understanding and agreed to proceed.   History and Present Illness: Patient is having swelling and pain in lower back.  He has been told he has torn ligaments and something leaking in lower back.  He was moving palettes and felt something pop in his back in June.  He did injections and they did not help.  He had mri in [place].  He had steroid injection. He said that the erythema and irritation and pain are keeping him from sleeping.  He has bulging and swelling in lower back and is keeping up at night.   1. Right lumbar radiculopathy     Outpatient Encounter Medications as of 10/04/2020  Medication Sig   predniSONE (DELTASONE) 20 MG tablet 2 po at same time daily for 5 days   gabapentin (NEURONTIN) 300 MG capsule Take 5 capsules (1,500 mg total) by mouth 2 (two) times daily.   JANUMET 50-1000 MG tablet TAKE 1 TABLET BY MOUTH 2 (TWO) TIMES DAILY WITH A MEAL   No facility-administered encounter medications on file as of 10/04/2020.    Review of Systems  Constitutional:  Negative for chills and fever.  Respiratory:  Negative for shortness of breath and wheezing.   Cardiovascular:  Negative for chest pain and leg swelling.  Musculoskeletal:  Positive for arthralgias and back pain. Negative for gait problem and  myalgias.  Skin:  Negative for rash.  All other systems reviewed and are negative.  Observations/Objective: Patient sounds comfortable and in no acute distress  Assessment and Plan: Problem List Items Addressed This Visit       Nervous and Auditory   Right lumbar radiculopathy - Primary   Relevant Medications   predniSONE (DELTASONE) 20 MG tablet  Picture shows inflammation of lower back, will send a short course of prednisone to give him relief over this weekend and he will continue to follow-up with his neurosurgeon  Follow up plan: No follow-ups on file.    I discussed the assessment and treatment plan with the patient. The patient was provided an opportunity to ask questions and all were answered. The patient agreed with the plan and demonstrated an understanding of the instructions.   The patient was advised to call back or seek an in-person evaluation if the symptoms worsen or if the condition fails to improve as anticipated.  The above assessment and management plan was discussed with the patient. The patient verbalized understanding of and has agreed to the management plan. Patient is aware to call the clinic if symptoms persist or worsen. Patient is aware when to return to the clinic for a follow-up visit. Patient educated on when it is appropriate to go to the emergency department.    I provided 12 minutes of non-face-to-face time during this encounter.    Matthew Pyle, MD

## 2020-10-21 ENCOUNTER — Ambulatory Visit (INDEPENDENT_AMBULATORY_CARE_PROVIDER_SITE_OTHER): Payer: BC Managed Care – PPO | Admitting: Neurology

## 2020-10-21 ENCOUNTER — Other Ambulatory Visit: Payer: Self-pay

## 2020-10-21 ENCOUNTER — Encounter: Payer: Self-pay | Admitting: Neurology

## 2020-10-21 VITALS — BP 140/80 | HR 81 | Ht 76.0 in | Wt 245.5 lb

## 2020-10-21 DIAGNOSIS — E1142 Type 2 diabetes mellitus with diabetic polyneuropathy: Secondary | ICD-10-CM

## 2020-10-21 DIAGNOSIS — M5416 Radiculopathy, lumbar region: Secondary | ICD-10-CM

## 2020-10-21 DIAGNOSIS — R292 Abnormal reflex: Secondary | ICD-10-CM | POA: Diagnosis not present

## 2020-10-21 MED ORDER — NORTRIPTYLINE HCL 25 MG PO CAPS: 25.0000 mg | ORAL_CAPSULE | Freq: Every day | ORAL | 3 refills | Status: DC

## 2020-10-21 NOTE — Progress Notes (Signed)
[facility]  PATIENT: Matthew Thompson DOB: Feb 20, 1965  REFERRING DOCTOR OR PCP: Mechele Claude MD SOURCE: Patient, notes from Dr. Darlyn Read, imaging reports, MRI images from last year were personally reviewed.  _________________________________   HISTORICAL  CHIEF COMPLAINT:  Chief Complaint  Patient presents with   Follow-up    RM 1. Last seen 07/30/20.    HISTORY OF PRESENT ILLNESS:  Matthew Thompson is a 55 y.o. with low back pain and right leg numbness.  He  has had some back pain x 6 years. Symptoms worsened after he had to move some pallets at work.   He has pain in the back in the back > legs.  He had an ESI about a month ago but he did not feel there was any benefit.  He still has numbness in the right lower leg to the top of the right foot and into the great toe.   The left leg and foot are fine.     He is on [employer].   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  He also had moderate foraminal narrowing that could affect the L5 nerve roots.  I do not have access to those MRI images (they have been requested).  However, I was able to MRI of the lumbar spine from 09/20/2019.  At L2-L3, there is mild spinal stenosis due to increased epidural fat and minimal disc bulging.  At L3-L4 and L4-L5, there is moderate spinal stenosis due  toepidural fat and also to disc bulging.  At L5-S1, there is reduced disc height, borderline retrolisthesis, disc bulging, endplate spurring.  These combine to cause moderate foraminal narrowing but no definite nerve root compression.  He has been prescribed gabapentin but it has not been beneficial..   He had urinary changes oat the last visit but feels these have improved.   No more episodes of urianry incontinence since symptoms started 3 weeks ago.  He has not had incontinence in the past..  MRI images: MRI of the lumbar spine from 09/20/2019 was personally  reviewed..  At L2-L3, there is mild spinal stenosis due to increased epidural fat and minimal disc bulging.  At L3-L4 and L4-L5, there is moderate spinal stenosis due  to epidural fat and also to disc bulging.  At L5-S1, there is reduced disc height, borderline retrolisthesis, disc bulging, endplate spurring.  These combine to cause moderate foraminal narrowing, left greater than right but no definite nerve root compression.  MRI of the lumbar spine 07/23/2020 (report only) 1.  Multilevel degenerative changes of the lumbar spine as detailed above without advanced canal or foraminal stenosis.  2.  Mild to moderate degrees of canal stenosis greatest at L3-4 and L4-5 secondary to degenerative changes and exacerbated by prominent dorsal epidural fat at L3-L5, as detailed above.  3.  Moderate foraminal stenosis bilaterally at L5-S1 with contact and deformityof the bilateral exiting L5 nerve roots.   REVIEW OF SYSTEMS: Constitutional: No fevers, chills, sweats, or change in appetite Eyes: No visual changes, double vision, eye pain Ear, nose and throat: No hearing loss, ear pain, nasal congestion, sore throat Cardiovascular: No chest pain, palpitations Respiratory:  No shortness of breath at rest or with exertion.   No wheezes GastrointestinaI: No nausea, vomiting, diarrhea, abdominal pain, fecal incontinence Genitourinary:  No dysuria, urinary retention or frequency.  No nocturia. Musculoskeletal:  No neck pain, back pain Integumentary: No rash, pruritus, skin lesions  Neurological: as above Psychiatric: No depression at this time.  No anxiety Endocrine: No palpitations, diaphoresis, change in appetite, change in weigh or increased thirst Hematologic/Lymphatic:  No anemia, purpura, petechiae. Allergic/Immunologic: No itchy/runny eyes, nasal congestion, recent allergic reactions, rashes  ALLERGIES: No Known Allergies  HOME MEDICATIONS:  Current Outpatient Medications:    gabapentin (NEURONTIN)  300 MG capsule, Take 5 capsules (1,500 mg total) by mouth 2 (two) times daily., Disp: 900 capsule, Rfl: 1   JANUMET 50-1000 MG tablet, TAKE 1 TABLET BY MOUTH 2 (TWO) TIMES DAILY WITH A MEAL, Disp: 60 tablet, Rfl: 0   nortriptyline (PAMELOR) 25 MG capsule, Take 1 capsule (25 mg total) by mouth at bedtime., Disp: 30 capsule, Rfl: 3  PAST MEDICAL HISTORY: Past Medical History:  Diagnosis Date   Diabetes mellitus without complication (HCC)    Hypertension     PAST SURGICAL HISTORY: Past Surgical History:  Procedure Laterality Date   HERNIA REPAIR     left hand      FAMILY HISTORY: Family History  Problem Relation Age of Onset   Stroke Mother    Heart disease Mother    Diabetes Mother    Hypertension Mother    Cancer Mother    Hypertension Father    Diabetes Father    Cancer Father    Diabetes Sister    Hypertension Sister    Diabetes Brother    Hypertension Brother     SOCIAL HISTORY:  Social History   Socioeconomic History   Marital status: Married    Spouse name: Matthew Thompson   Number of children: 4   Years of education: 12   Highest education level: Not on file  Occupational History   Not on file  Tobacco Use   Smoking status: Every Day    Packs/day: 0.50    Years: 21.00    Pack years: 10.50    Types: Cigarettes   Smokeless tobacco: Never  Vaping Use   Vaping Use: Never used  Substance and Sexual Activity   Alcohol use: Yes    Comment: occassionally   Drug use: No   Sexual activity: Yes  Other Topics Concern   Not on file  Social History Narrative   Right handed   2-3 cups coffee per day   Soda daily   Social Determinants of Health   Financial Resource Strain: Not on file  Food Insecurity: Not on file  Transportation Needs: Not on file  Physical Activity: Not on file  Stress: Not on file  Social Connections: Not on file  Intimate Partner Violence: Not on file     PHYSICAL EXAM  Vitals:   10/21/20 1403  BP: 140/80  Pulse: 81  Weight:  245 lb 8 oz (111.4 kg)  Height: 6\' 4"  (1.93 m)    Body mass index is 29.88 kg/m.   General: The patient is well-developed and well-nourished and in no acute distress  HEENT:  Head is [place]/AT.  Sclera are anicteric.    Neck: No carotid bruits are noted.  The neck is nontender.  Good range of motion  Back: He has mild tenderness in the lower lumbar paraspinal muscles.  Neurologic Exam  Mental status: The patient is alert and oriented x 3 at the time of the examination. The patient has apparent normal recent and remote memory, with an apparently normal attention span and concentration ability.   Speech is normal.  Cranial nerves: Extraocular movements are full. Pupils are equal, round, and reactive to light and  accomodation.  Facial strength and sensation were normal.. No obvious hearing deficits are noted.  Motor:  Muscle bulk is normal.   Tone is normal. Strength is  5 / 5 in all 4 extremities except 4+/5 strength in the toe extensors on the right.  Sensory: Sensory testing is intact to pinprick, soft touch and vibration sensation in the arms of the left leg.  He has reduced sensation medial dorsum of the foot and into the first few toes.     Coordination: Cerebellar testing reveals good finger-nose-finger and heel-to-shin bilaterally.  Gait and station: Station is normal.   Gait is arthritic. Tandem gait is wide. Romberg is negative.   Reflexes: Deep tendon reflexes are symmetric and normal in the arms.  He has increased 3+ reflexes at the knees with crossed abductor responses.  DTRs were 3 symmetric at the ankles  No clonus       DIAGNOSTIC DATA (LABS, IMAGING, TESTING) - I reviewed patient records, labs, notes, testing and imaging myself where available.  Lab Results  Component Value Date   WBC 10.5 12/07/2019   HGB 13.6 12/07/2019   HCT 40.7 12/07/2019   MCV 95.5 12/07/2019   PLT 293 12/07/2019      Component Value Date/Time   NA 133 (L) 12/07/2019 1250   NA 139  11/13/2019 0815   K 3.8 12/07/2019 1250   CL 101 12/07/2019 1250   CO2 24 12/07/2019 1250   GLUCOSE 109 (H) 12/07/2019 1250   BUN 8 12/07/2019 1250   BUN 11 11/13/2019 0815   CREATININE 0.82 12/07/2019 1250   CALCIUM 8.8 (L) 12/07/2019 1250   PROT 7.2 11/13/2019 0815   ALBUMIN 4.6 11/13/2019 0815   AST 20 11/13/2019 0815   ALT 31 11/13/2019 0815   ALKPHOS 114 11/13/2019 0815   BILITOT 0.5 11/13/2019 0815   GFRNONAA >60 12/07/2019 1250   GFRAA 102 11/13/2019 0815   Lab Results  Component Value Date   CHOL 105 11/13/2019   HDL 34 (L) 11/13/2019   LDLCALC 46 11/13/2019   TRIG 142 11/13/2019   CHOLHDL 3.1 11/13/2019   Lab Results  Component Value Date   HGBA1C 8.3 (H) 11/13/2019       ASSESSMENT AND PLAN  Right lumbar radiculopathy - Plan: Ambulatory referral to Neurosurgery  Hyperreflexia  Diabetic polyneuropathy associated with type 2 diabetes mellitus (HCC)   Symptoms could be due to an L5 radiculopathy on the right.  MRI does show foraminal narrowing bilaterally at L5-S1, worse on the right side.  Additionally, he has moderate spinal stenosis at L4-L5 that is due to mostly to increased epidural fat.  Since he did not get a benefit from medications or epidural steroid injection, I will have him see neurosurgery. He had no benefit from gabapentin.  I will have him try nortriptyline.  Additionally he should take Tylenol.  Due to his diabetes he is not a good candidate for chronic NSAIDs The minimal numbness in his feet (painless) could be due to mild diabetic polyneuropathy.  This is not explaining his predominant pain.   DTRs are elevated in the legs.  He had had some episodes of urinary incontinence several months ago but none since.  If this returns we would need to check an MRI of the cervical spine.   Return in 4 months or sooner if new or worsening neurologic symptoms.    Haidy Kackley A. Epimenio Foot, MD, [facility] 10/21/2020, 6:16 PM Certified in Neurology, Clinical  Neurophysiology, Sleep Medicine and Neuroimaging  [facility] [address] [place], [place] [address] [phone]

## 2020-10-22 ENCOUNTER — Telehealth: Payer: Self-pay | Admitting: Neurology

## 2020-10-22 NOTE — Telephone Encounter (Signed)
Referral sent to [facility]. Phone: [phone]. 

## 2020-10-23 ENCOUNTER — Other Ambulatory Visit: Payer: Self-pay

## 2020-10-23 ENCOUNTER — Ambulatory Visit (INDEPENDENT_AMBULATORY_CARE_PROVIDER_SITE_OTHER): Payer: BC Managed Care – PPO | Admitting: Family Medicine

## 2020-10-23 ENCOUNTER — Encounter: Payer: Self-pay | Admitting: Family Medicine

## 2020-10-23 VITALS — BP 139/72 | HR 63 | Temp 98.3°F | Ht 76.0 in | Wt 245.6 lb

## 2020-10-23 DIAGNOSIS — M5416 Radiculopathy, lumbar region: Secondary | ICD-10-CM | POA: Diagnosis not present

## 2020-10-23 DIAGNOSIS — E1141 Type 2 diabetes mellitus with diabetic mononeuropathy: Secondary | ICD-10-CM

## 2020-10-23 DIAGNOSIS — E782 Mixed hyperlipidemia: Secondary | ICD-10-CM | POA: Diagnosis not present

## 2020-10-23 LAB — CBC WITH DIFFERENTIAL/PLATELET
Basophils Absolute: 0.1 10*3/uL (ref 0.0–0.2)
Basos: 1 %
EOS (ABSOLUTE): 0.2 10*3/uL (ref 0.0–0.4)
Eos: 2 %
Hematocrit: 44 % (ref 37.5–51.0)
Hemoglobin: 15.4 g/dL (ref 13.0–17.7)
Immature Grans (Abs): 0 10*3/uL (ref 0.0–0.1)
Immature Granulocytes: 0 %
Lymphocytes Absolute: 2.7 10*3/uL (ref 0.7–3.1)
Lymphs: 34 %
MCH: 31.9 pg (ref 26.6–33.0)
MCHC: 35 g/dL (ref 31.5–35.7)
MCV: 91 fL (ref 79–97)
Monocytes Absolute: 1.1 10*3/uL — ABNORMAL HIGH (ref 0.1–0.9)
Monocytes: 13 %
Neutrophils Absolute: 3.9 10*3/uL (ref 1.4–7.0)
Neutrophils: 50 %
Platelets: 267 10*3/uL (ref 150–450)
RBC: 4.83 x10E6/uL (ref 4.14–5.80)
RDW: 12.8 % (ref 11.6–15.4)
WBC: 7.8 10*3/uL (ref 3.4–10.8)

## 2020-10-23 LAB — CMP14+EGFR
ALT: 22 IU/L (ref 0–44)
AST: 16 IU/L (ref 0–40)
Albumin/Globulin Ratio: 1.8 (ref 1.2–2.2)
Albumin: 4.6 g/dL (ref 3.8–4.9)
Alkaline Phosphatase: 116 IU/L (ref 44–121)
BUN/Creatinine Ratio: 10 (ref 9–20)
BUN: 10 mg/dL (ref 6–24)
Bilirubin Total: 0.4 mg/dL (ref 0.0–1.2)
CO2: 23 mmol/L (ref 20–29)
Calcium: 9.3 mg/dL (ref 8.7–10.2)
Chloride: 101 mmol/L (ref 96–106)
Creatinine, Ser: 1.02 mg/dL (ref 0.76–1.27)
Globulin, Total: 2.5 g/dL (ref 1.5–4.5)
Glucose: 190 mg/dL — ABNORMAL HIGH (ref 65–99)
Potassium: 4.5 mmol/L (ref 3.5–5.2)
Sodium: 136 mmol/L (ref 134–144)
Total Protein: 7.1 g/dL (ref 6.0–8.5)
eGFR: 88 mL/min/{1.73_m2} (ref >59)

## 2020-10-23 LAB — LIPID PANEL
Chol/HDL Ratio: 5.6 ratio — ABNORMAL HIGH (ref 0.0–5.0)
Cholesterol, Total: 185 mg/dL (ref 100–199)
HDL: 33 mg/dL — ABNORMAL LOW (ref >39)
LDL Chol Calc (NIH): 126 mg/dL — ABNORMAL HIGH (ref 0–99)
Triglycerides: 146 mg/dL (ref 0–149)
VLDL Cholesterol Cal: 26 mg/dL (ref 5–40)

## 2020-10-23 LAB — BAYER DCA HB A1C WAIVED: HB A1C (BAYER DCA - WAIVED): 8.2 % — ABNORMAL HIGH (ref 4.8–5.6)

## 2020-10-23 MED ORDER — DAPAGLIFLOZIN PROPANEDIOL 10 MG PO TABS: 10.0000 mg | ORAL_TABLET | Freq: Every day | ORAL | 3 refills | Status: DC

## 2020-10-23 MED ORDER — NORTRIPTYLINE HCL 25 MG PO CAPS: 25.0000 mg | ORAL_CAPSULE | Freq: Every day | ORAL | 3 refills | Status: DC

## 2020-10-23 MED ORDER — JANUMET 50-1000 MG PO TABS
1.0000 | ORAL_TABLET | Freq: Two times a day (BID) | ORAL | 3 refills | Status: DC
Start: 2020-10-23 — End: 2021-03-27

## 2020-10-23 NOTE — Progress Notes (Signed)
Subjective:  Patient ID: Matthew Thompson, male    DOB: [birth year]  Age: 55 y.o. MRN: [id number]  CC: Medical Management of Chronic Issues   HPI HADDON FYFE presents forFollow-up of diabetes. Patient not checking blood sugar at home.  Patient denies symptoms such as polyuria, polydipsia, excessive hunger, nausea No significant hypoglycemic spells noted. Medications reviewed. Pt reports taking them regularly without complication/adverse reaction being reported today.  Checking feet daily.  Still has significant right lower back pain with radiation pain into the posterolateral right thigh. MRI of 6/22 reports bilateral L5 foraminal stenosis. Referred to neurosurgeon. Appointment pending.  No relief with Gaba or Lyrica. Neurology wrote for Pamelor 2 days ago. Pt. Planning to pick up the scrip today.    History Matthew Thompson has a past medical history of Diabetes mellitus without complication ([place]) and Hypertension.   He has a past surgical history that includes left hand and Hernia repair.   His family history includes Cancer in his father and mother; Diabetes in his brother, father, mother, and sister; Heart disease in his mother; Hypertension in his brother, father, mother, and sister; Stroke in his mother.He reports that he has been smoking cigarettes. He has a 10.50 pack-year smoking history. He has never used smokeless tobacco. He reports current alcohol use. He reports that he does not use drugs.  No current outpatient medications on file prior to visit.   No current facility-administered medications on file prior to visit.    ROS Review of Systems  Constitutional:  Negative for fever.  Respiratory:  Negative for shortness of breath.   Cardiovascular:  Negative for chest pain.  Musculoskeletal:  Negative for arthralgias.  Skin:  Negative for rash.   Objective:  BP 139/72   Pulse 63   Temp 98.3 F (36.8 C)   Ht 6' 4"  (1.93 m)   Wt 245 lb 9.6 oz (111.4 kg)   SpO2 98%   BMI  29.90 kg/m   BP Readings from Last 3 Encounters:  10/23/20 139/72  10/21/20 140/80  07/30/20 (!) 160/77    Wt Readings from Last 3 Encounters:  10/23/20 245 lb 9.6 oz (111.4 kg)  10/21/20 245 lb 8 oz (111.4 kg)  07/30/20 244 lb (110.7 kg)     Physical Exam Vitals reviewed.  Constitutional:      Appearance: He is well-developed.  HENT:     Head: Normocephalic and atraumatic.     Right Ear: External ear normal.     Left Ear: External ear normal.     Mouth/Throat:     Pharynx: No oropharyngeal exudate or posterior oropharyngeal erythema.  Eyes:     Pupils: Pupils are equal, round, and reactive to light.  Cardiovascular:     Rate and Rhythm: Normal rate and regular rhythm.     Heart sounds: No murmur heard. Pulmonary:     Effort: No respiratory distress.     Breath sounds: Normal breath sounds.  Musculoskeletal:        General: Tenderness (midline lower lumbar region. Mild edema. No palpable spasm.) present.     Cervical back: Normal range of motion and neck supple.  Neurological:     Mental Status: He is alert and oriented to person, place, and time.      Assessment & Plan:   Matthew Thompson was seen today for medical management of chronic issues.  Diagnoses and all orders for this visit:  Type 2 diabetes mellitus with diabetic mononeuropathy, without long-term current use of insulin ([place]) -  Bayer DCA Hb A1c Waived -     CBC with Differential/Platelet -     CMP14+EGFR -     Microalbumin / creatinine urine ratio  Mixed hyperlipidemia -     Lipid panel  Right lumbar radiculopathy  Other orders -     sitaGLIPtin-metformin (JANUMET) 50-1000 MG tablet; Take 1 tablet by mouth 2 (two) times daily with a meal. -     nortriptyline (PAMELOR) 25 MG capsule; Take 1 capsule (25 mg total) by mouth at bedtime. -     dapagliflozin propanediol (FARXIGA) 10 MG TABS tablet; Take 1 tablet (10 mg total) by mouth daily before breakfast.     I have discontinued Maxamilian L. Matthew Thompson's  gabapentin and pregabalin. I have also changed his Janumet. Additionally, I am having him start on dapagliflozin propanediol. Lastly, I am having him maintain his nortriptyline.  Meds ordered this encounter  Medications   sitaGLIPtin-metformin (JANUMET) 50-1000 MG tablet    Sig: Take 1 tablet by mouth 2 (two) times daily with a meal.    Dispense:  180 tablet    Refill:  3   nortriptyline (PAMELOR) 25 MG capsule    Sig: Take 1 capsule (25 mg total) by mouth at bedtime.    Dispense:  30 capsule    Refill:  3   dapagliflozin propanediol (FARXIGA) 10 MG TABS tablet    Sig: Take 1 tablet (10 mg total) by mouth daily before breakfast.    Dispense:  90 tablet    Refill:  3     Follow-up: Return in about 3 months (around 01/22/2021).  Claretta Fraise, M.D.

## 2020-10-25 ENCOUNTER — Other Ambulatory Visit: Payer: Self-pay | Admitting: Family Medicine

## 2020-10-25 MED ORDER — ROSUVASTATIN CALCIUM 10 MG PO TABS: 10.0000 mg | ORAL_TABLET | Freq: Every day | ORAL | 1 refills | Status: DC

## 2020-12-24 ENCOUNTER — Encounter: Payer: Self-pay | Admitting: Family

## 2020-12-24 ENCOUNTER — Encounter: Payer: Self-pay | Admitting: Family Medicine

## 2020-12-24 ENCOUNTER — Ambulatory Visit (INDEPENDENT_AMBULATORY_CARE_PROVIDER_SITE_OTHER): Payer: BC Managed Care – PPO | Admitting: Family

## 2020-12-24 DIAGNOSIS — J209 Acute bronchitis, unspecified: Secondary | ICD-10-CM | POA: Diagnosis not present

## 2020-12-24 MED ORDER — BENZONATATE 200 MG PO CAPS: 200.0000 mg | ORAL_CAPSULE | Freq: Three times a day (TID) | ORAL | 1 refills | Status: DC | PRN

## 2020-12-24 MED ORDER — CETIRIZINE HCL 10 MG PO TABS: 10.0000 mg | ORAL_TABLET | Freq: Every day | ORAL | 11 refills | Status: DC

## 2020-12-24 MED ORDER — FLUTICASONE PROPIONATE 50 MCG/ACT NA SUSP: 2.0000 | Freq: Every day | NASAL | 6 refills | Status: DC

## 2020-12-24 NOTE — Progress Notes (Signed)
Virtual Visit  Note Due to COVID-19 pandemic this visit was conducted virtually. This visit type was conducted due to national recommendations for restrictions regarding the COVID-19 Pandemic (e.g. social distancing, sheltering in place) in an effort to limit this patient's exposure and mitigate transmission in our community. All issues noted in this document were discussed and addressed.  A physical exam was not performed with this format.  I connected with Matthew Thompson on 12/24/20 at 1:57 pm  by telephone and verified that I am speaking with the correct person using two identifiers. Matthew Thompson is currently located at work and no one is currently with him  during visit. The provider, Jannifer Rodney, FNP is located in their office at time of visit.  I discussed the limitations, risks, security and privacy concerns of performing an evaluation and management service by telephone and the availability of in person appointments. I also discussed with the patient that there may be a patient responsible charge related to this service. The patient expressed understanding and agreed to proceed.  [name], Matthew Thompson are scheduled for a virtual visit with your provider today.    Just as we do with appointments in the office, we must obtain your consent to participate.  Your consent will be active for this visit and any virtual visit you may have with one of our providers in the next 365 days.    If you have a MyChart account, I can also send a copy of this consent to you electronically.  All virtual visits are billed to your insurance company just like a traditional visit in the office.  As this is a virtual visit, video technology does not allow for your provider to perform a traditional examination.  This may limit your provider's ability to fully assess your condition.  If your provider identifies any concerns that need to be evaluated in person or the need to arrange testing such as labs, EKG, etc, we will  make arrangements to do so.    Although advances in technology are sophisticated, we cannot ensure that it will always work on either your end or our end.  If the connection with a video visit is poor, we may have to switch to a telephone visit.  With either a video or telephone visit, we are not always able to ensure that we have a secure connection.   I need to obtain your verbal consent now.   Are you willing to proceed with your visit today?   Matthew Thompson has provided verbal consent on 12/24/2020 for a virtual visit (video or telephone).   Jannifer Rodney, [place] 12/24/2020  2:01 PM   History and Present Illness:  Pt calls the office today with cough that started a few days ago. He is a smoker.  Cough This is a new problem. The current episode started in the past 7 days. The problem has been gradually worsening. The problem occurs every few minutes. The cough is Productive of sputum. Associated symptoms include nasal congestion and postnasal drip. Pertinent negatives include no chills, ear congestion, ear pain, fever, myalgias, sore throat, shortness of breath or wheezing. Risk factors for lung disease include smoking/tobacco exposure. He has tried rest for the symptoms. The treatment provided mild relief.     Review of Systems  Constitutional:  Negative for chills and fever.  HENT:  Positive for postnasal drip. Negative for ear pain and sore throat.   Respiratory:  Positive for cough. Negative for shortness of breath  and wheezing.   Musculoskeletal:  Negative for myalgias.    Observations/Objective: No SOB or distress noted  Assessment and Plan: 1. Acute bronchitis, unspecified organism - Take meds as prescribed - Use a cool mist humidifier  -Use saline nose sprays frequently -Force fluids -For any cough or congestion  Use plain Mucinex- regular strength or max strength is fine -For fever or aces or pains- take tylenol or ibuprofen. -Throat lozenges if help -RTO if symptoms  worsen or do not improve  - benzonatate (TESSALON) 200 MG capsule; Take 1 capsule (200 mg total) by mouth 3 (three) times daily as needed.  Dispense: 30 capsule; Refill: 1 - cetirizine (ZYRTEC) 10 MG tablet; Take 1 tablet (10 mg total) by mouth daily.  Dispense: 30 tablet; Refill: 11 - fluticasone (FLONASE) 50 MCG/ACT nasal spray; Place 2 sprays into both nostrils daily.  Dispense: 16 g; Refill: 6    I discussed the assessment and treatment plan with the patient. The patient was provided an opportunity to ask questions and all were answered. The patient agreed with the plan and demonstrated an understanding of the instructions.   The patient was advised to call back or seek an in-person evaluation if the symptoms worsen or if the condition fails to improve as anticipated.  The above assessment and management plan was discussed with the patient. The patient verbalized understanding of and has agreed to the management plan. Patient is aware to call the clinic if symptoms persist or worsen. Patient is aware when to return to the clinic for a follow-up visit. Patient educated on when it is appropriate to go to the emergency department.   Time call ended:  2:08 pm   I provided 11 minutes of  non face-to-face time during this encounter.    Jannifer Rodney, FNP

## 2021-01-10 ENCOUNTER — Encounter: Payer: Self-pay | Admitting: Family Medicine

## 2021-01-22 ENCOUNTER — Ambulatory Visit: Payer: BC Managed Care – PPO | Admitting: Family Medicine

## 2021-01-22 ENCOUNTER — Encounter: Payer: Self-pay | Admitting: Family Medicine

## 2021-03-17 NOTE — Telephone Encounter (Signed)
Pt walked in to office today asking for letter and FMLA ppw from December. The letter needs to have him out 07/2020 and going back some time in March of 2023. It is for on going back problems. Pt says that emplpyer has not received FMLA ppw. Please call back [phone]

## 2021-03-19 ENCOUNTER — Telehealth: Payer: Self-pay | Admitting: *Deleted

## 2021-03-19 ENCOUNTER — Ambulatory Visit: Payer: BC Managed Care – PPO

## 2021-03-19 ENCOUNTER — Ambulatory Visit: Payer: BC Managed Care – PPO | Admitting: Nurse Practitioner

## 2021-03-19 ENCOUNTER — Other Ambulatory Visit: Payer: Self-pay | Admitting: Nurse Practitioner

## 2021-03-19 DIAGNOSIS — E1165 Type 2 diabetes mellitus with hyperglycemia: Secondary | ICD-10-CM

## 2021-03-19 NOTE — Telephone Encounter (Signed)
Blood sugar 451 last night 379 this morning (fasting)

## 2021-03-19 NOTE — Telephone Encounter (Signed)
Patient will be here by 4:30 today

## 2021-03-25 ENCOUNTER — Encounter: Payer: Self-pay | Admitting: Family Medicine

## 2021-03-27 ENCOUNTER — Ambulatory Visit: Payer: BC Managed Care – PPO | Admitting: Family Medicine

## 2021-03-27 ENCOUNTER — Encounter: Payer: Self-pay | Admitting: Family Medicine

## 2021-03-27 VITALS — BP 157/87 | HR 86 | Temp 98.2°F | Ht 76.0 in | Wt 227.2 lb

## 2021-03-27 DIAGNOSIS — E782 Mixed hyperlipidemia: Secondary | ICD-10-CM | POA: Diagnosis not present

## 2021-03-27 DIAGNOSIS — E1165 Type 2 diabetes mellitus with hyperglycemia: Secondary | ICD-10-CM

## 2021-03-27 LAB — BAYER DCA HB A1C WAIVED: HB A1C (BAYER DCA - WAIVED): 11 % — ABNORMAL HIGH (ref 4.8–5.6)

## 2021-03-27 MED ORDER — ROSUVASTATIN CALCIUM 10 MG PO TABS: 10.0000 mg | ORAL_TABLET | Freq: Every day | ORAL | 3 refills | Status: DC

## 2021-03-27 MED ORDER — DAPAGLIFLOZIN PROPANEDIOL 10 MG PO TABS: 10.0000 mg | ORAL_TABLET | Freq: Every day | ORAL | 3 refills | Status: DC

## 2021-03-27 MED ORDER — JANUMET 50-1000 MG PO TABS: 1.0000 | ORAL_TABLET | Freq: Two times a day (BID) | ORAL | 3 refills | Status: DC

## 2021-03-27 NOTE — Progress Notes (Signed)
° °  Subjective:  Patient ID: Matthew Thompson, male    DOB: 1965/03/24  Age: 56 y.o. MRN: [id number]  CC: No chief complaint on file.   HPI Matthew Thompson presents for Follow-up of diabetes. Patient checks blood sugar at home.  [id number] last week. 231 last night.  Patient denies symptoms such as polyuria, polydipsia, excessive hunger, nausea No significant hypoglycemic spells noted. Medications reviewed. Pt reports taking them regularly without complication/adverse reaction being reported today.    History Matthew Thompson has a past medical history of Diabetes mellitus without complication (HCC) and Hypertension.   He has a past surgical history that includes left hand and Hernia repair.   His family history includes Cancer in his father and mother; Diabetes in his brother, father, mother, and sister; Heart disease in his mother; Hypertension in his brother, father, mother, and sister; Stroke in his mother.He reports that he has been smoking cigarettes. He has a 10.50 pack-year smoking history. He has never used smokeless tobacco. He reports current alcohol use. He reports that he does not use drugs.  Current Outpatient Medications on File Prior to Visit  Medication Sig Dispense Refill   benzonatate (TESSALON) 200 MG capsule Take 1 capsule (200 mg total) by mouth 3 (three) times daily as needed. 30 capsule 1   cetirizine (ZYRTEC) 10 MG tablet Take 1 tablet (10 mg total) by mouth daily. 30 tablet 11   dapagliflozin propanediol (FARXIGA) 10 MG TABS tablet Take 1 tablet (10 mg total) by mouth daily before breakfast. 90 tablet 3   fluticasone (FLONASE) 50 MCG/ACT nasal spray Place 2 sprays into both nostrils daily. 16 g 6   nortriptyline (PAMELOR) 25 MG capsule Take 1 capsule (25 mg total) by mouth at bedtime. 30 capsule 3   rosuvastatin (CRESTOR) 10 MG tablet Take 1 tablet (10 mg total) by mouth daily. For cholesterol 90 tablet 1   sitaGLIPtin-metformin (JANUMET) 50-1000 MG tablet Take 1 tablet by mouth  2 (two) times daily with a meal. 180 tablet 3   No current facility-administered medications on file prior to visit.    ROS Review of Systems  Objective:  There were no vitals taken for this visit.  BP Readings from Last 3 Encounters:  03/27/21 (!) 157/87  10/23/20 139/72  10/21/20 140/80    Wt Readings from Last 3 Encounters:  03/27/21 227 lb 3.2 oz (103.1 kg)  10/23/20 245 lb 9.6 oz (111.4 kg)  10/21/20 245 lb 8 oz (111.4 kg)     Physical Exam    Assessment & Plan:   There are no diagnoses linked to this encounter.    I am having Matthew Thompson maintain his Janumet, nortriptyline, dapagliflozin propanediol, rosuvastatin, benzonatate, cetirizine, and fluticasone.  No orders of the defined types were placed in this encounter.    Follow-up: No follow-ups on file.  Mechele Claude, M.D.

## 2021-03-27 NOTE — Progress Notes (Signed)
Subjective:  Patient ID: GADGE HERMIZ, male    DOB: [birth year]  Age: 56 y.o. MRN: [id number]  CC: Medical Management of Chronic Issues   HPI CALIBER LANDESS presents forFollow-up of diabetes. Patient not checking at home. Patient denies symptoms such as polyuria, polydipsia, excessive hunger, nausea No significant hypoglycemic spells noted. Medications reviewed. Pt reports taking them regularly without complication/adverse reaction being reported today.    Still out of work due to back problem.   History [place] has a past medical history of Diabetes mellitus without complication ([place]) and Hypertension.   He has a past surgical history that includes left hand and Hernia repair.   His family history includes Cancer in his father and mother; Diabetes in his brother, father, mother, and sister; Heart disease in his mother; Hypertension in his brother, father, mother, and sister; Stroke in his mother.He reports that he has been smoking cigarettes. He has a 10.50 pack-year smoking history. He has never used smokeless tobacco. He reports current alcohol use. He reports that he does not use drugs.  Current Outpatient Medications on File Prior to Visit  Medication Sig Dispense Refill   benzonatate (TESSALON) 200 MG capsule Take 1 capsule (200 mg total) by mouth 3 (three) times daily as needed. 30 capsule 1   cetirizine (ZYRTEC) 10 MG tablet Take 1 tablet (10 mg total) by mouth daily. 30 tablet 11   fluticasone (FLONASE) 50 MCG/ACT nasal spray Place 2 sprays into both nostrils daily. 16 g 6   nortriptyline (PAMELOR) 25 MG capsule Take 1 capsule (25 mg total) by mouth at bedtime. 30 capsule 3   No current facility-administered medications on file prior to visit.    ROS Review of Systems  Constitutional:  Negative for fever.  Respiratory:  Negative for shortness of breath.   Cardiovascular:  Negative for chest pain.  Musculoskeletal:  Negative for arthralgias.  Skin:  Negative for rash.    Objective:  BP (!) 157/87    Pulse 86    Temp 98.2 F (36.8 C)    Ht 6' 4"  (1.93 m)    Wt 227 lb 3.2 oz (103.1 kg)    SpO2 98%    BMI 27.66 kg/m   BP Readings from Last 3 Encounters:  03/27/21 (!) 157/87  10/23/20 139/72  10/21/20 140/80    Wt Readings from Last 3 Encounters:  03/27/21 227 lb 3.2 oz (103.1 kg)  10/23/20 245 lb 9.6 oz (111.4 kg)  10/21/20 245 lb 8 oz (111.4 kg)     Physical Exam Vitals reviewed.  Constitutional:      Appearance: He is well-developed.  HENT:     Head: Normocephalic and atraumatic.     Right Ear: External ear normal.     Left Ear: External ear normal.     Mouth/Throat:     Pharynx: No oropharyngeal exudate or posterior oropharyngeal erythema.  Eyes:     Pupils: Pupils are equal, round, and reactive to light.  Cardiovascular:     Rate and Rhythm: Normal rate and regular rhythm.     Heart sounds: No murmur heard. Pulmonary:     Effort: No respiratory distress.     Breath sounds: Normal breath sounds.  Musculoskeletal:     Cervical back: Normal range of motion and neck supple.  Neurological:     Mental Status: He is alert and oriented to person, place, and time.      Assessment & Plan:   Imer was seen today for medical  management of chronic issues.  Diagnoses and all orders for this visit:  Type 2 diabetes mellitus with hyperglycemia, without long-term current use of insulin (HCC) -     Bayer DCA Hb A1c Waived -     CBC with Differential/Platelet -     CMP14+EGFR  Mixed hyperlipidemia -     Lipid panel  Other orders -     rosuvastatin (CRESTOR) 10 MG tablet; Take 1 tablet (10 mg total) by mouth daily. For cholesterol -     dapagliflozin propanediol (FARXIGA) 10 MG TABS tablet; Take 1 tablet (10 mg total) by mouth daily before breakfast. -     sitaGLIPtin-metformin (JANUMET) 50-1000 MG tablet; Take 1 tablet by mouth 2 (two) times daily with a meal.      I am having Dacoda L. Decarlo maintain his nortriptyline,  benzonatate, cetirizine, fluticasone, rosuvastatin, dapagliflozin propanediol, and Janumet.  Meds ordered this encounter  Medications   rosuvastatin (CRESTOR) 10 MG tablet    Sig: Take 1 tablet (10 mg total) by mouth daily. For cholesterol    Dispense:  90 tablet    Refill:  3   dapagliflozin propanediol (FARXIGA) 10 MG TABS tablet    Sig: Take 1 tablet (10 mg total) by mouth daily before breakfast.    Dispense:  90 tablet    Refill:  3   sitaGLIPtin-metformin (JANUMET) 50-1000 MG tablet    Sig: Take 1 tablet by mouth 2 (two) times daily with a meal.    Dispense:  180 tablet    Refill:  3     Follow-up: No follow-ups on file.  Claretta Fraise, M.D.

## 2021-03-29 ENCOUNTER — Encounter: Payer: Self-pay | Admitting: Family Medicine

## 2021-03-29 LAB — CMP14+EGFR
ALT: 29 IU/L (ref 0–44)
AST: 22 IU/L (ref 0–40)
Albumin/Globulin Ratio: 1.8 (ref 1.2–2.2)
Albumin: 4.7 g/dL (ref 3.8–4.9)
Alkaline Phosphatase: 125 IU/L — ABNORMAL HIGH (ref 44–121)
BUN/Creatinine Ratio: 19 (ref 9–20)
BUN: 20 mg/dL (ref 6–24)
Bilirubin Total: 0.3 mg/dL (ref 0.0–1.2)
CO2: 19 mmol/L — ABNORMAL LOW (ref 20–29)
Calcium: 9.4 mg/dL (ref 8.7–10.2)
Chloride: 102 mmol/L (ref 96–106)
Creatinine, Ser: 1.04 mg/dL (ref 0.76–1.27)
Globulin, Total: 2.6 g/dL (ref 1.5–4.5)
Glucose: 235 mg/dL — ABNORMAL HIGH (ref 70–99)
Potassium: 4.5 mmol/L (ref 3.5–5.2)
Sodium: 139 mmol/L (ref 134–144)
Total Protein: 7.3 g/dL (ref 6.0–8.5)
eGFR: 86 mL/min/{1.73_m2} (ref >59)

## 2021-03-29 LAB — CBC WITH DIFFERENTIAL/PLATELET
Basophils Absolute: 0.1 10*3/uL (ref 0.0–0.2)
Basos: 1 %
EOS (ABSOLUTE): 0.2 10*3/uL (ref 0.0–0.4)
Eos: 2 %
Hematocrit: 45.8 % (ref 37.5–51.0)
Hemoglobin: 16.3 g/dL (ref 13.0–17.7)
Immature Grans (Abs): 0 10*3/uL (ref 0.0–0.1)
Immature Granulocytes: 0 %
Lymphocytes Absolute: 3.8 10*3/uL — ABNORMAL HIGH (ref 0.7–3.1)
Lymphs: 39 %
MCH: 33.1 pg — ABNORMAL HIGH (ref 26.6–33.0)
MCHC: 35.6 g/dL (ref 31.5–35.7)
MCV: 93 fL (ref 79–97)
Monocytes Absolute: 1.2 10*3/uL — ABNORMAL HIGH (ref 0.1–0.9)
Monocytes: 12 %
Neutrophils Absolute: 4.4 10*3/uL (ref 1.4–7.0)
Neutrophils: 46 %
Platelets: 243 10*3/uL (ref 150–450)
RBC: 4.93 x10E6/uL (ref 4.14–5.80)
RDW: 14.3 % (ref 11.6–15.4)
WBC: 9.7 10*3/uL (ref 3.4–10.8)

## 2021-03-29 LAB — LIPID PANEL
Chol/HDL Ratio: 4.9 ratio (ref 0.0–5.0)
Cholesterol, Total: 222 mg/dL — ABNORMAL HIGH (ref 100–199)
HDL: 45 mg/dL (ref >39)
LDL Chol Calc (NIH): 78 mg/dL (ref 0–99)
Triglycerides: 622 mg/dL (ref 0–149)
VLDL Cholesterol Cal: 99 mg/dL — ABNORMAL HIGH (ref 5–40)

## 2021-04-07 ENCOUNTER — Ambulatory Visit: Payer: BC Managed Care – PPO | Admitting: Family Medicine

## 2021-04-28 ENCOUNTER — Other Ambulatory Visit (HOSPITAL_COMMUNITY)
Admission: RE | Admit: 2021-04-28 | Discharge: 2021-04-28 | Disposition: A | Discharge status: Home / Self Care | Payer: BC Managed Care – PPO | Source: Ambulatory Visit | Attending: Family Medicine | Admitting: Family Medicine

## 2021-04-28 ENCOUNTER — Ambulatory Visit (INDEPENDENT_AMBULATORY_CARE_PROVIDER_SITE_OTHER): Payer: BC Managed Care – PPO | Admitting: Family Medicine

## 2021-04-28 ENCOUNTER — Encounter: Payer: Self-pay | Admitting: Family Medicine

## 2021-04-28 ENCOUNTER — Ambulatory Visit: Payer: BC Managed Care – PPO | Admitting: Family Medicine

## 2021-04-28 VITALS — BP 127/67 | HR 82 | Ht 76.0 in | Wt 233.0 lb

## 2021-04-28 DIAGNOSIS — Z202 Contact with and (suspected) exposure to infections with a predominantly sexual mode of transmission: Secondary | ICD-10-CM

## 2021-04-28 DIAGNOSIS — G629 Polyneuropathy, unspecified: Secondary | ICD-10-CM

## 2021-04-28 MED ORDER — DOXYCYCLINE HYCLATE 100 MG PO TABS: 100.0000 mg | ORAL_TABLET | Freq: Two times a day (BID) | ORAL | 0 refills | Status: AC

## 2021-04-28 NOTE — Progress Notes (Signed)
? ?BP 127/67   Pulse 82   Ht 6\' 4"  (1.93 m)   Wt 233 lb (105.7 kg)   SpO2 94%   BMI 28.36 kg/m?   ? ?Subjective:  ? ?Patient ID: Matthew Thompson, male    DOB: [birth year], 56 y.o.   MRN: [id number] ? ?HPI: ?Matthew Thompson is a 56 y.o. male presenting on 04/28/2021 for arm numbness (Left arm. Started three weeks ago. Comes and goes. Starts at elbow and tingles down to hand. Denies chest pain, SOB) ? ? ?HPI ?Chlamydia exposure ?Patient is coming in today because he says he has a possible chlamydia exposure.  He has partner had intercourse with a few days ago and does have some dysuria and he says he was exposed to chlamydia.  He says he has not had intercourse with his wife since this partner and he says his partner got treated for it. ? ?Patient says he is getting some shooting left arm numbness that happens occasionally.  He denies any shortness of breath or chest pain associated with it.  He says it will be just his forearm and sometimes on the outside of his arm.  It happens for 40 seconds and then it is gone and it happens a couple times a week.  He denies any issue on exertion or chest tightness on exertion. ? ?Relevant past medical, surgical, family and social history reviewed and updated as indicated. Interim medical history since our last visit reviewed. ?Allergies and medications reviewed and updated. ? ?Review of Systems  ?Constitutional:  Negative for chills and fever.  ?Eyes:  Negative for visual disturbance.  ?Respiratory:  Negative for shortness of breath and wheezing.   ?Cardiovascular:  Negative for chest pain and leg swelling.  ?Genitourinary:  Positive for dysuria. Negative for hematuria, penile discharge, penile pain, penile swelling and scrotal swelling.  ?Musculoskeletal:  Negative for back pain and gait problem.  ?Skin:  Negative for rash.  ?Neurological:  Positive for numbness.  ?All other systems reviewed and are negative. ? ?Per HPI unless specifically indicated above ? ? ?Allergies as of  04/28/2021   ?No Known Allergies ?  ? ?  ?Medication List  ?  ? ?  ? Accurate as of April 28, 2021  2:41 PM. If you have any questions, ask your nurse or doctor.  ?  ?  ? ?  ? ?STOP taking these medications   ? ?benzonatate 200 MG capsule ?Commonly known as: TESSALON ?Stopped by: April 30, 2021, MD ?  ?cetirizine 10 MG tablet ?Commonly known as: ZYRTEC ?Stopped by: Nils Pyle, MD ?  ?nortriptyline 25 MG capsule ?Commonly known as: PAMELOR ?Stopped by: Nils Pyle, MD ?  ? ?  ? ?TAKE these medications   ? ?dapagliflozin propanediol 10 MG Tabs tablet ?Commonly known as: Nils Pyle ?Take 1 tablet (10 mg total) by mouth daily before breakfast. ?  ?doxycycline 100 MG tablet ?Commonly known as: VIBRA-TABS ?Take 1 tablet (100 mg total) by mouth 2 (two) times daily for 7 days. 1 po bid ?Started by: [place], MD ?  ?fluticasone 50 MCG/ACT nasal spray ?Commonly known as: FLONASE ?Place 2 sprays into both nostrils daily. ?  ?Janumet 50-1000 MG tablet ?Generic drug: sitaGLIPtin-metformin ?Take 1 tablet by mouth 2 (two) times daily with a meal. ?  ?rosuvastatin 10 MG tablet ?Commonly known as: Crestor ?Take 1 tablet (10 mg total) by mouth daily. For cholesterol ?  ? ?  ? ? ? ?Objective:  ? ?BP  127/67   Pulse 82   Ht 6\' 4"  (1.93 m)   Wt 233 lb (105.7 kg)   SpO2 94%   BMI 28.36 kg/m?   ?Wt Readings from Last 3 Encounters:  ?04/28/21 233 lb (105.7 kg)  ?03/27/21 227 lb 3.2 oz (103.1 kg)  ?10/23/20 245 lb 9.6 oz (111.4 kg)  ?  ?Physical Exam ?Vitals and nursing note reviewed.  ?Constitutional:   ?   General: He is not in acute distress. ?   Appearance: He is well-developed. He is not diaphoretic.  ?Eyes:  ?   General: No scleral icterus. ?   Conjunctiva/sclera: Conjunctivae normal.  ?Neck:  ?   Thyroid: No thyromegaly.  ?Cardiovascular:  ?   Rate and Rhythm: Normal rate and regular rhythm.  ?   Heart sounds: Normal heart sounds. No murmur heard. ?Pulmonary:  ?   Effort: Pulmonary effort is normal. No  respiratory distress.  ?   Breath sounds: Normal breath sounds. No wheezing.  ?Musculoskeletal:     ?   General: No swelling, tenderness or deformity. Normal range of motion.  ?   Left forearm: Normal. No swelling, edema, deformity, lacerations, tenderness or bony tenderness.  ?   Left wrist: Normal. No deformity, tenderness, bony tenderness or crepitus. Normal range of motion.  ?   Left hand: No deformity or tenderness. Normal range of motion. Normal strength. Normal sensation. Normal capillary refill.  ?   Cervical back: Neck supple.  ?Lymphadenopathy:  ?   Cervical: No cervical adenopathy.  ?Skin: ?   General: Skin is warm and dry.  ?   Findings: No rash.  ?Neurological:  ?   Mental Status: He is alert and oriented to person, place, and time.  ?   Coordination: Coordination normal.  ?Psychiatric:     ?   Behavior: Behavior normal.  ? ? ? ? ?Assessment & Plan:  ? ?Problem List Items Addressed This Visit   ?None ?Visit Diagnoses   ? ? Neuropathy    -  Primary  ? Exposure to chlamydia      ? Relevant Medications  ? doxycycline (VIBRA-TABS) 100 MG tablet  ? Other Relevant Orders  ? GC/Chlamydia probe amp ([facility])not at [facility]  ? ?  ?  ?Left arm numbness does not sound related to cardiac based on history, recommended to keep a close eye on it and monitor when it happens and where it happens, sounds similar to something like carpal tunnel.  Discuss further with PCP. ? ?Patient says he has chlamydia exposure due to a partner who notified him and he does have dysuria, we will go ahead and treat but we will also do testing for ?Follow up plan: ?Return if symptoms worsen or fail to improve. ? ?Counseling provided for all of the vaccine components ?No orders of the defined types were placed in this encounter. ? ? ?[facility], MD ?[facility] ?04/28/2021, 2:41 PM ? ? ? ? ?

## 2021-04-30 LAB — GC/CHLAMYDIA PROBE AMP (~~LOC~~) NOT AT ARMC
Chlamydia: NEGATIVE
Comment: NEGATIVE
Comment: NORMAL
Neisseria Gonorrhea: NEGATIVE

## 2021-06-12 ENCOUNTER — Telehealth: Payer: Self-pay | Admitting: Family Medicine

## 2021-06-12 ENCOUNTER — Encounter: Payer: Self-pay | Admitting: Family Medicine

## 2021-06-12 NOTE — Telephone Encounter (Signed)
Printed. On my desk ?

## 2021-06-13 NOTE — Telephone Encounter (Signed)
Letter up front - pt aware 

## 2021-06-25 ENCOUNTER — Ambulatory Visit: Payer: BC Managed Care – PPO | Admitting: Family Medicine

## 2021-07-10 ENCOUNTER — Encounter: Payer: Self-pay | Admitting: Family Medicine

## 2021-07-10 ENCOUNTER — Ambulatory Visit: Payer: BC Managed Care – PPO | Admitting: Family Medicine

## 2021-07-10 DIAGNOSIS — E782 Mixed hyperlipidemia: Secondary | ICD-10-CM

## 2021-07-10 DIAGNOSIS — E1165 Type 2 diabetes mellitus with hyperglycemia: Secondary | ICD-10-CM

## 2021-07-16 LAB — HM DIABETES EYE EXAM

## 2021-07-30 ENCOUNTER — Ambulatory Visit (INDEPENDENT_AMBULATORY_CARE_PROVIDER_SITE_OTHER): Payer: No Typology Code available for payment source | Admitting: Family Medicine

## 2021-07-30 ENCOUNTER — Encounter: Payer: Self-pay | Admitting: Family Medicine

## 2021-07-30 VITALS — BP 129/82 | HR 95 | Temp 97.8°F | Ht 76.0 in | Wt 238.0 lb

## 2021-07-30 DIAGNOSIS — E782 Mixed hyperlipidemia: Secondary | ICD-10-CM | POA: Diagnosis not present

## 2021-07-30 DIAGNOSIS — E1165 Type 2 diabetes mellitus with hyperglycemia: Secondary | ICD-10-CM | POA: Diagnosis not present

## 2021-07-30 LAB — BAYER DCA HB A1C WAIVED: HB A1C (BAYER DCA - WAIVED): 7.7 % — ABNORMAL HIGH (ref 4.8–5.6)

## 2021-07-30 NOTE — Progress Notes (Signed)
Subjective:  Patient ID: Matthew Thompson,  male    DOB: 1965/12/21  Age: 56 y.o.    CC: Medical Management of Chronic Issues   HPI Matthew Thompson presents for  follow-up of elevated cholesterol. Doing well without complaints on current medication. Denies side effects  including myalgia and arthralgia and nausea. Also in today for liver function testing. Currently no chest pain, shortness of breath or other cardiovascular related symptoms noted.  Follow-up of diabetes. Patient does check blood sugar at home. Readings run around 160 prandial Patient denies symptoms such as excessive hunger or urinary frequency, excessive hunger, nausea No significant hypoglycemic spells noted. Medications reviewed. Pt reports taking them regularly. Pt. denies complication/adverse reaction today.    History Matthew Thompson has a past medical history of Diabetes mellitus without complication ([place]) and Hypertension.   He has a past surgical history that includes left hand and Hernia repair.   His family history includes Cancer in his father and mother; Diabetes in his brother, father, mother, and sister; Heart disease in his mother; Hypertension in his brother, father, mother, and sister; Stroke in his mother.He reports that he has been smoking cigarettes. He has a 10.50 pack-year smoking history. He has never used smokeless tobacco. He reports current alcohol use. He reports that he does not use drugs.  Current Outpatient Medications on File Prior to Visit  Medication Sig Dispense Refill   dapagliflozin propanediol (FARXIGA) 10 MG TABS tablet Take 1 tablet (10 mg total) by mouth daily before breakfast. 90 tablet 3   fluticasone (FLONASE) 50 MCG/ACT nasal spray Place 2 sprays into both nostrils daily. 16 g 6   rosuvastatin (CRESTOR) 10 MG tablet Take 1 tablet (10 mg total) by mouth daily. For cholesterol 90 tablet 3   sitaGLIPtin-metformin (JANUMET) 50-1000 MG tablet Take 1 tablet by mouth 2 (two) times daily with  a meal. 180 tablet 3   No current facility-administered medications on file prior to visit.    ROS Review of Systems  Constitutional:  Negative for fever.  Respiratory:  Negative for shortness of breath.   Cardiovascular:  Negative for chest pain.  Musculoskeletal:  Negative for arthralgias.  Skin:  Negative for rash.    Objective:  BP 129/82   Pulse 95   Temp 97.8 F (36.6 C)   Ht _0  (1.93 m)   Wt 238 lb (108 kg)   SpO2 97%   BMI 28.97 kg/m   BP Readings from Last 3 Encounters:  07/30/21 129/82  04/28/21 127/67  03/27/21 (!) 157/87    Wt Readings from Last 3 Encounters:  07/30/21 238 lb (108 kg)  04/28/21 233 lb (105.7 kg)  03/27/21 227 lb 3.2 oz (103.1 kg)     Physical Exam Vitals reviewed.  Constitutional:      Appearance: He is well-developed.  HENT:     Head: Normocephalic and atraumatic.     Right Ear: External ear normal.     Left Ear: External ear normal.     Mouth/Throat:     Pharynx: No oropharyngeal exudate or posterior oropharyngeal erythema.  Eyes:     Pupils: Pupils are equal, round, and reactive to light.  Cardiovascular:     Rate and Rhythm: Normal rate and regular rhythm.     Heart sounds: No murmur heard. Pulmonary:     Effort: No respiratory distress.     Breath sounds: Normal breath sounds.  Musculoskeletal:     Cervical back: Normal range of motion and neck  supple.  Neurological:     Mental Status: He is alert and oriented to person, place, and time.     A1c today = 7.7   Lab Results  Component Value Date   HGBA1C 11.0 (H) 03/27/2021   HGBA1C 8.2 (H) 10/23/2020   HGBA1C 8.3 (H) 11/13/2019    Assessment & Plan:   Matthew Thompson was seen today for medical management of chronic issues.  Diagnoses and all orders for this visit:  Type 2 diabetes mellitus with hyperglycemia, without long-term current use of insulin ([place]) -     Bayer DCA Hb A1c Waived -     CBC with Differential/Platelet -     CMP14+EGFR -     Microalbumin /  creatinine urine ratio  Mixed hyperlipidemia -     Lipid panel   I am having Matthew Thompson maintain his fluticasone, rosuvastatin, dapagliflozin propanediol, and Janumet.  No orders of the defined types were placed in this encounter.  Dramatically improved A1c. Closing in on goal. Not exercising. Needs to increase daily routine.   Follow-up: Return in about 3 months (around 10/30/2021).  Claretta Fraise, M.D.

## 2021-07-31 ENCOUNTER — Encounter: Payer: Self-pay | Admitting: Family Medicine

## 2021-07-31 LAB — CBC WITH DIFFERENTIAL/PLATELET
Basophils Absolute: 0.1 10*3/uL (ref 0.0–0.2)
Basos: 1 %
EOS (ABSOLUTE): 0.3 10*3/uL (ref 0.0–0.4)
Eos: 3 %
Hematocrit: 47.6 % (ref 37.5–51.0)
Hemoglobin: 16.4 g/dL (ref 13.0–17.7)
Immature Grans (Abs): 0 10*3/uL (ref 0.0–0.1)
Immature Granulocytes: 0 %
Lymphocytes Absolute: 3.4 10*3/uL — ABNORMAL HIGH (ref 0.7–3.1)
Lymphs: 40 %
MCH: 32.3 pg (ref 26.6–33.0)
MCHC: 34.5 g/dL (ref 31.5–35.7)
MCV: 94 fL (ref 79–97)
Monocytes Absolute: 1.3 10*3/uL — ABNORMAL HIGH (ref 0.1–0.9)
Monocytes: 15 %
Neutrophils Absolute: 3.5 10*3/uL (ref 1.4–7.0)
Neutrophils: 41 %
Platelets: 240 10*3/uL (ref 150–450)
RBC: 5.07 x10E6/uL (ref 4.14–5.80)
RDW: 12.5 % (ref 11.6–15.4)
WBC: 8.5 10*3/uL (ref 3.4–10.8)

## 2021-07-31 LAB — LIPID PANEL
Chol/HDL Ratio: 8.8 ratio — ABNORMAL HIGH (ref 0.0–5.0)
Cholesterol, Total: 255 mg/dL — ABNORMAL HIGH (ref 100–199)
HDL: 29 mg/dL — ABNORMAL LOW (ref >39)
LDL Chol Calc (NIH): 107 mg/dL — ABNORMAL HIGH (ref 0–99)
Triglycerides: 681 mg/dL (ref 0–149)
VLDL Cholesterol Cal: 119 mg/dL — ABNORMAL HIGH (ref 5–40)

## 2021-07-31 LAB — CMP14+EGFR
ALT: 21 IU/L (ref 0–44)
AST: 12 IU/L (ref 0–40)
Albumin/Globulin Ratio: 1.7 (ref 1.2–2.2)
Albumin: 4.5 g/dL (ref 3.8–4.9)
Alkaline Phosphatase: 113 IU/L (ref 44–121)
BUN/Creatinine Ratio: 17 (ref 9–20)
BUN: 16 mg/dL (ref 6–24)
Bilirubin Total: <0.2 mg/dL (ref 0.0–1.2)
CO2: 24 mmol/L (ref 20–29)
Calcium: 9.5 mg/dL (ref 8.7–10.2)
Chloride: 102 mmol/L (ref 96–106)
Creatinine, Ser: 0.93 mg/dL (ref 0.76–1.27)
Globulin, Total: 2.7 g/dL (ref 1.5–4.5)
Glucose: 120 mg/dL — ABNORMAL HIGH (ref 70–99)
Potassium: 4.2 mmol/L (ref 3.5–5.2)
Sodium: 140 mmol/L (ref 134–144)
Total Protein: 7.2 g/dL (ref 6.0–8.5)
eGFR: 98 mL/min/{1.73_m2} (ref >59)

## 2021-08-01 ENCOUNTER — Other Ambulatory Visit: Payer: Self-pay | Admitting: Family Medicine

## 2021-08-01 MED ORDER — ROSUVASTATIN CALCIUM 20 MG PO TABS
20.0000 mg | ORAL_TABLET | Freq: Every day | ORAL | 3 refills | Status: DC
Start: 2021-08-01 — End: 2022-10-21

## 2021-08-14 ENCOUNTER — Encounter: Payer: Self-pay | Admitting: Family Medicine

## 2021-08-14 MED ORDER — NABUMETONE 500 MG PO TABS: 1000.0000 mg | ORAL_TABLET | Freq: Two times a day (BID) | ORAL | 1 refills | Status: DC

## 2021-09-03 ENCOUNTER — Ambulatory Visit: Payer: BC Managed Care – PPO | Admitting: Family Medicine

## 2021-10-08 ENCOUNTER — Encounter: Payer: Self-pay | Admitting: Family Medicine

## 2021-10-13 ENCOUNTER — Other Ambulatory Visit: Payer: Self-pay | Admitting: Family Medicine

## 2021-10-13 ENCOUNTER — Telehealth: Payer: Self-pay | Admitting: Family Medicine

## 2021-10-13 MED ORDER — METFORMIN HCL ER 500 MG PO TB24: 1000.0000 mg | ORAL_TABLET | Freq: Two times a day (BID) | ORAL | 0 refills | Status: DC

## 2021-10-13 NOTE — Telephone Encounter (Signed)
LEFT DETAILED VOICE MESSAGE 

## 2021-10-13 NOTE — Telephone Encounter (Signed)
I sent in the metformin. One month supply. Due for follow up in early October

## 2021-11-03 ENCOUNTER — Encounter: Payer: Self-pay | Admitting: Family Medicine

## 2021-11-03 ENCOUNTER — Ambulatory Visit (INDEPENDENT_AMBULATORY_CARE_PROVIDER_SITE_OTHER): Payer: No Typology Code available for payment source | Admitting: Family Medicine

## 2021-11-03 VITALS — BP 143/78 | HR 80 | Temp 98.3°F | Ht 76.0 in | Wt 233.8 lb

## 2021-11-03 DIAGNOSIS — E782 Mixed hyperlipidemia: Secondary | ICD-10-CM

## 2021-11-03 DIAGNOSIS — E1165 Type 2 diabetes mellitus with hyperglycemia: Secondary | ICD-10-CM | POA: Diagnosis not present

## 2021-11-03 DIAGNOSIS — J301 Allergic rhinitis due to pollen: Secondary | ICD-10-CM

## 2021-11-03 LAB — BAYER DCA HB A1C WAIVED: HB A1C (BAYER DCA - WAIVED): 11.2 % — ABNORMAL HIGH (ref 4.8–5.6)

## 2021-11-03 MED ORDER — METFORMIN HCL ER 500 MG PO TB24: 1000.0000 mg | ORAL_TABLET | Freq: Two times a day (BID) | ORAL | 2 refills | Status: DC

## 2021-11-03 MED ORDER — FEXOFENADINE HCL 180 MG PO TABS: 180.0000 mg | ORAL_TABLET | Freq: Every day | ORAL | 11 refills | Status: DC

## 2021-11-03 MED ORDER — TRULICITY 0.75 MG/0.5ML ~~LOC~~ SOAJ: 0.7500 mg | SUBCUTANEOUS | 2 refills | Status: DC

## 2021-11-03 NOTE — Progress Notes (Signed)
Subjective:  Patient ID: Matthew Thompson, male    DOB: Oct 26, 1965  Age: 56 y.o. MRN: [id number]  CC: Medical Management of Chronic Issues   HPI Matthew Thompson presents forFollow-up of diabetes. Patient checks blood sugar at home.   150+ fasting and 200+ postprandial Patient denies symptoms such as polyuria, polydipsia, excessive hunger, nausea No significant hypoglycemic spells noted. Medications reviewed. Pt reports taking them regularly without complication/adverse reaction being reported today.    History Matthew Thompson has a past medical history of Diabetes mellitus without complication ([place]) and Hypertension.   Matthew Thompson has a past surgical history that includes left hand and Hernia repair.   His family history includes Cancer in his father and mother; Diabetes in his brother, father, mother, and sister; Heart disease in his mother; Hypertension in his brother, father, mother, and sister; Stroke in his mother.Matthew Thompson reports that Matthew Thompson has been smoking cigarettes. Matthew Thompson has a 10.50 pack-year smoking history. Matthew Thompson has never used smokeless tobacco. Matthew Thompson reports current alcohol use. Matthew Thompson reports that Matthew Thompson does not use drugs.  Current Outpatient Medications on File Prior to Visit  Medication Sig Dispense Refill   dapagliflozin propanediol (FARXIGA) 10 MG TABS tablet Take 1 tablet (10 mg total) by mouth daily before breakfast. 90 tablet 3   fluticasone (FLONASE) 50 MCG/ACT nasal spray Place 2 sprays into both nostrils daily. 16 g 6   nabumetone (RELAFEN) 500 MG tablet Take 2 tablets (1,000 mg total) by mouth 2 (two) times daily. For muscle and joint pain 360 tablet 1   rosuvastatin (CRESTOR) 20 MG tablet Take 1 tablet (20 mg total) by mouth daily. For cholesterol 90 tablet 3   No current facility-administered medications on file prior to visit.    ROS Review of Systems  Constitutional:  Negative for fever.  HENT:  Positive for congestion (early AM rhino that is green. Not recurring through the day).    Respiratory:  Negative for shortness of breath.   Cardiovascular:  Negative for chest pain.  Musculoskeletal:  Negative for arthralgias.  Skin:  Negative for rash.    Objective:  BP (!) 143/78   Pulse 80   Temp 98.3 F (36.8 C)   Ht 6' 4"  (1.93 m)   Wt 233 lb 12.8 oz (106.1 kg)   SpO2 98%   BMI 28.46 kg/m   BP Readings from Last 3 Encounters:  11/03/21 (!) 143/78  07/30/21 129/82  04/28/21 127/67    Wt Readings from Last 3 Encounters:  11/03/21 233 lb 12.8 oz (106.1 kg)  07/30/21 238 lb (108 kg)  04/28/21 233 lb (105.7 kg)     Physical Exam Vitals reviewed.  Constitutional:      Appearance: Matthew Thompson is well-developed.  HENT:     Head: Normocephalic and atraumatic.     Right Ear: External ear normal.     Left Ear: External ear normal.     Mouth/Throat:     Pharynx: No oropharyngeal exudate or posterior oropharyngeal erythema.  Eyes:     Pupils: Pupils are equal, round, and reactive to light.  Cardiovascular:     Rate and Rhythm: Normal rate and regular rhythm.     Heart sounds: No murmur heard. Pulmonary:     Effort: No respiratory distress.     Breath sounds: Normal breath sounds.  Musculoskeletal:     Cervical back: Normal range of motion and neck supple.  Neurological:     Mental Status: Matthew Thompson is alert and oriented to person, place, and time.  Assessment & Plan:   Matthew Thompson was seen today for medical management of chronic issues.  Diagnoses and all orders for this visit:  Type 2 diabetes mellitus with hyperglycemia, without long-term current use of insulin (HCC) -     Bayer DCA Hb A1c Waived -     CBC with Differential/Platelet -     CMP14+EGFR -     Lipid panel -     Microalbumin / creatinine urine ratio -     metFORMIN (GLUCOPHAGE-XR) 500 MG 24 hr tablet; Take 2 tablets (1,000 mg total) by mouth 2 (two) times daily with a meal. -     Dulaglutide (TRULICITY) 2.26 JF/3.5KT SOPN; Inject 0.75 mg into the skin once a week.  Mixed hyperlipidemia -      Lipid panel  Seasonal allergic rhinitis due to pollen -     fexofenadine (ALLEGRA) 180 MG tablet; Take 1 tablet (180 mg total) by mouth daily. For allergy symptoms    A  I am having Matthew Thompson start on fexofenadine and Trulicity. I am also having him maintain his fluticasone, dapagliflozin propanediol, rosuvastatin, nabumetone, and metFORMIN.  Meds ordered this encounter  Medications   metFORMIN (GLUCOPHAGE-XR) 500 MG 24 hr tablet    Sig: Take 2 tablets (1,000 mg total) by mouth 2 (two) times daily with a meal.    Dispense:  120 tablet    Refill:  2   fexofenadine (ALLEGRA) 180 MG tablet    Sig: Take 1 tablet (180 mg total) by mouth daily. For allergy symptoms    Dispense:  30 tablet    Refill:  11   Dulaglutide (TRULICITY) 6.25 WL/8.9HT SOPN    Sig: Inject 0.75 mg into the skin once a week.    Dispense:  2 mL    Refill:  2     Follow-up: Return in about 3 months (around 02/03/2022).  Claretta Fraise, M.D.

## 2021-11-04 LAB — CMP14+EGFR
ALT: 22 IU/L (ref 0–44)
AST: 15 IU/L (ref 0–40)
Albumin/Globulin Ratio: 2.1 (ref 1.2–2.2)
Albumin: 4.6 g/dL (ref 3.8–4.9)
Alkaline Phosphatase: 107 IU/L (ref 44–121)
BUN/Creatinine Ratio: 9 (ref 9–20)
BUN: 10 mg/dL (ref 6–24)
Bilirubin Total: 0.5 mg/dL (ref 0.0–1.2)
CO2: 20 mmol/L (ref 20–29)
Calcium: 9.3 mg/dL (ref 8.7–10.2)
Chloride: 101 mmol/L (ref 96–106)
Creatinine, Ser: 1.17 mg/dL (ref 0.76–1.27)
Globulin, Total: 2.2 g/dL (ref 1.5–4.5)
Glucose: 190 mg/dL — ABNORMAL HIGH (ref 70–99)
Potassium: 4 mmol/L (ref 3.5–5.2)
Sodium: 137 mmol/L (ref 134–144)
Total Protein: 6.8 g/dL (ref 6.0–8.5)
eGFR: 75 mL/min/{1.73_m2} (ref >59)

## 2021-11-04 LAB — CBC WITH DIFFERENTIAL/PLATELET
Basophils Absolute: 0.1 10*3/uL (ref 0.0–0.2)
Basos: 1 %
EOS (ABSOLUTE): 0.1 10*3/uL (ref 0.0–0.4)
Eos: 2 %
Hematocrit: 43.4 % (ref 37.5–51.0)
Hemoglobin: 15.5 g/dL (ref 13.0–17.7)
Immature Grans (Abs): 0 10*3/uL (ref 0.0–0.1)
Immature Granulocytes: 0 %
Lymphocytes Absolute: 3.4 10*3/uL — ABNORMAL HIGH (ref 0.7–3.1)
Lymphs: 43 %
MCH: 32.8 pg (ref 26.6–33.0)
MCHC: 35.7 g/dL (ref 31.5–35.7)
MCV: 92 fL (ref 79–97)
Monocytes Absolute: 0.8 10*3/uL (ref 0.1–0.9)
Monocytes: 11 %
Neutrophils Absolute: 3.4 10*3/uL (ref 1.4–7.0)
Neutrophils: 43 %
Platelets: 242 10*3/uL (ref 150–450)
RBC: 4.73 x10E6/uL (ref 4.14–5.80)
RDW: 12.9 % (ref 11.6–15.4)
WBC: 7.8 10*3/uL (ref 3.4–10.8)

## 2021-11-04 LAB — LIPID PANEL
Chol/HDL Ratio: 3.8 ratio (ref 0.0–5.0)
Cholesterol, Total: 115 mg/dL (ref 100–199)
HDL: 30 mg/dL — ABNORMAL LOW (ref >39)
LDL Chol Calc (NIH): 35 mg/dL (ref 0–99)
Triglycerides: 337 mg/dL — ABNORMAL HIGH (ref 0–149)
VLDL Cholesterol Cal: 50 mg/dL — ABNORMAL HIGH (ref 5–40)

## 2021-11-04 NOTE — Progress Notes (Signed)
Hello [place],  Your lab result is normal and/or stable.Some minor variations that are not significant are commonly marked abnormal, but do not represent any medical problem for you.  Best regards, Tiquan Bouch, M.D.

## 2021-12-15 ENCOUNTER — Other Ambulatory Visit: Payer: Self-pay | Admitting: *Deleted

## 2021-12-15 MED ORDER — ACCU-CHEK SOFTCLIX LANCETS MISC: 3 refills | Status: DC

## 2021-12-15 MED ORDER — ACCU-CHEK GUIDE VI STRP: ORAL_STRIP | 3 refills | Status: DC

## 2021-12-19 ENCOUNTER — Telehealth: Payer: Self-pay | Admitting: Family Medicine

## 2021-12-19 NOTE — Telephone Encounter (Signed)
Appointment scheduled for 11/28 with Dr. Darlyn Read.

## 2021-12-30 ENCOUNTER — Encounter: Payer: Self-pay | Admitting: Family Medicine

## 2021-12-30 ENCOUNTER — Ambulatory Visit (INDEPENDENT_AMBULATORY_CARE_PROVIDER_SITE_OTHER): Payer: No Typology Code available for payment source | Admitting: Family Medicine

## 2021-12-30 VITALS — BP 167/79 | HR 59 | Temp 97.9°F | Ht 76.0 in | Wt 232.6 lb

## 2021-12-30 DIAGNOSIS — E1165 Type 2 diabetes mellitus with hyperglycemia: Secondary | ICD-10-CM

## 2021-12-30 LAB — BAYER DCA HB A1C WAIVED: HB A1C (BAYER DCA - WAIVED): 10.6 % — ABNORMAL HIGH (ref 4.8–5.6)

## 2021-12-30 NOTE — Progress Notes (Signed)
Subjective:  Patient ID: Matthew Thompson, male    DOB: 1965-08-07  Age: 56 y.o. MRN: [id number]  CC: Diabetes   HPI Matthew Thompson presents forFollow-up of diabetes. Patient checks blood sugar at home occassionally.   152 this AM fasting and 106 postprandial Patient denies symptoms such as polyuria, polydipsia, excessive hunger, nausea No significant hypoglycemic spells noted. Medications reviewed. Pt reports taking them regularly without complication/adverse reaction being reported today.   Pt. Is a candidate for a spinal stimulator implant. He wore the external version and says he felt good. He got the feeling in his legs back. Could be more active. He has to have his A1c under 8.0 to have the procedure at [facility] with Dr. Joelene Millin. Conundrum is that he can't exercise as needed to help with the A1c.    History Matthew Thompson has a past medical history of Diabetes mellitus without complication (HCC) and Hypertension.   He has a past surgical history that includes left hand and Hernia repair.   His family history includes Cancer in his father and mother; Diabetes in his brother, father, mother, and sister; Heart disease in his mother; Hypertension in his brother, father, mother, and sister; Stroke in his mother.He reports that he has been smoking cigarettes. He has a 10.50 pack-year smoking history. He has never used smokeless tobacco. He reports current alcohol use. He reports that he does not use drugs.  Current Outpatient Medications on File Prior to Visit  Medication Sig Dispense Refill   Accu-Chek Softclix Lancets lancets Check BS BID Dx E11.65 200 each 3   dapagliflozin propanediol (FARXIGA) 10 MG TABS tablet Take 1 tablet (10 mg total) by mouth daily before breakfast. 90 tablet 3   Dulaglutide (TRULICITY) 0.75 MG/0.5ML SOPN Inject 0.75 mg into the skin once a week. 2 mL 2   fexofenadine (ALLEGRA) 180 MG tablet Take 1 tablet (180 mg total) by mouth daily. For allergy symptoms 30 tablet 11    fluticasone (FLONASE) 50 MCG/ACT nasal spray Place 2 sprays into both nostrils daily. 16 g 6   glucose blood (ACCU-CHEK GUIDE) test strip Check BS BID Dx E11.65 200 each 3   metFORMIN (GLUCOPHAGE-XR) 500 MG 24 hr tablet Take 2 tablets (1,000 mg total) by mouth 2 (two) times daily with a meal. 120 tablet 2   nabumetone (RELAFEN) 500 MG tablet Take 2 tablets (1,000 mg total) by mouth 2 (two) times daily. For muscle and joint pain 360 tablet 1   rosuvastatin (CRESTOR) 20 MG tablet Take 1 tablet (20 mg total) by mouth daily. For cholesterol 90 tablet 3   No current facility-administered medications on file prior to visit.    ROS Review of Systems  Musculoskeletal:  Positive for gait problem.  Neurological:  Positive for weakness and numbness (at legs).    Objective:  BP (!) 167/79   Pulse (!) 59   Temp 97.9 F (36.6 C)   Ht 6\' 4"  (1.93 m)   Wt 232 lb 9.6 oz (105.5 kg)   SpO2 99%   BMI 28.31 kg/m   BP Readings from Last 3 Encounters:  12/30/21 (!) 167/79  11/03/21 (!) 143/78  07/30/21 129/82    Wt Readings from Last 3 Encounters:  12/30/21 232 lb 9.6 oz (105.5 kg)  11/03/21 233 lb 12.8 oz (106.1 kg)  07/30/21 238 lb (108 kg)     Physical Exam Vitals reviewed.  Constitutional:      Appearance: He is well-developed.  HENT:  Head: Normocephalic and atraumatic.     Right Ear: External ear normal.     Left Ear: External ear normal.     Mouth/Throat:     Pharynx: No oropharyngeal exudate or posterior oropharyngeal erythema.  Eyes:     Pupils: Pupils are equal, round, and reactive to light.  Cardiovascular:     Rate and Rhythm: Normal rate and regular rhythm.     Heart sounds: No murmur heard. Pulmonary:     Effort: No respiratory distress.     Breath sounds: Normal breath sounds.  Musculoskeletal:        General: Tenderness (lower back) present.     Cervical back: Normal range of motion and neck supple.  Neurological:     Mental Status: He is alert and oriented to  person, place, and time.       Assessment & Plan:   Matthew Thompson was seen today for diabetes.  Diagnoses and all orders for this visit:  Type 2 diabetes mellitus with hyperglycemia, without long-term current use of insulin (HCC) -     Bayer DCA Hb A1c Waived   A1c = 10.6   I am having Matthew Thompson maintain his fluticasone, dapagliflozin propanediol, rosuvastatin, nabumetone, metFORMIN, fexofenadine, Trulicity, Accu-Chek Guide, and Accu-Chek Softclix Lancets. Hasn't started the trulicity. Seems gun shy about the shots. Has it at home will start today. I agreed that if nausea doesn't occur, or other significant side effects, I will titrate more quickly than usual to the 1.5 dose in 2 weeks.     Follow-up: Return in about 1 month (around 01/29/2022).  Mechele Claude, M.D.

## 2022-01-11 ENCOUNTER — Other Ambulatory Visit: Payer: Self-pay | Admitting: Family Medicine

## 2022-02-03 ENCOUNTER — Encounter: Payer: Self-pay | Admitting: Family Medicine

## 2022-02-03 ENCOUNTER — Ambulatory Visit (INDEPENDENT_AMBULATORY_CARE_PROVIDER_SITE_OTHER): Payer: No Typology Code available for payment source | Admitting: Family Medicine

## 2022-02-03 VITALS — BP 150/81 | HR 79 | Temp 97.5°F | Ht 76.0 in | Wt 235.8 lb

## 2022-02-03 DIAGNOSIS — E1165 Type 2 diabetes mellitus with hyperglycemia: Secondary | ICD-10-CM

## 2022-02-03 DIAGNOSIS — E782 Mixed hyperlipidemia: Secondary | ICD-10-CM

## 2022-02-03 LAB — BAYER DCA HB A1C WAIVED: HB A1C (BAYER DCA - WAIVED): 10 % — ABNORMAL HIGH (ref 4.8–5.6)

## 2022-02-03 LAB — LIPID PANEL

## 2022-02-03 MED ORDER — TRULICITY 1.5 MG/0.5ML ~~LOC~~ SOAJ: SUBCUTANEOUS | 1 refills | Status: DC

## 2022-02-03 NOTE — Progress Notes (Signed)
Subjective:  Patient ID: Matthew Thompson, male    DOB: 04-19-1965  Age: 57 y.o. MRN: [id number]  CC: Medical Management of Chronic Issues   HPI Matthew Thompson presents forFollow-up of diabetes. Patient checks blood sugar at home.   78-104 fasting and 110-126 postprandial Patient denies symptoms such as polyuria, polydipsia, excessive hunger, nausea No significant hypoglycemic spells noted. Medications reviewed. Pt reports taking them regularly without complication/adverse reaction being reported today. Just Started the Trulicity .75 after last appt., 5 weeks ago. Missed a week during that time.  To get spiinal stimulator once A1c is bbelow 8.0. The external one worked well.    History Matthew Thompson has a past medical history of Diabetes mellitus without complication ([place]) and Hypertension.   Matthew Thompson has a past surgical history that includes left hand and Hernia repair.   His family history includes Cancer in his father and mother; Diabetes in his brother, father, mother, and sister; Heart disease in his mother; Hypertension in his brother, father, mother, and sister; Stroke in his mother.Matthew Thompson reports that Matthew Thompson has been smoking cigarettes. Matthew Thompson has a 10.50 pack-year smoking history. Matthew Thompson has never used smokeless tobacco. Matthew Thompson reports current alcohol use. Matthew Thompson reports that Matthew Thompson does not use drugs.  Current Outpatient Medications on File Prior to Visit  Medication Sig Dispense Refill   Accu-Chek Softclix Lancets lancets Check BS BID Dx E11.65 200 each 3   dapagliflozin propanediol (FARXIGA) 10 MG TABS tablet Take 1 tablet (10 mg total) by mouth daily before breakfast. 90 tablet 3   fexofenadine (ALLEGRA) 180 MG tablet Take 1 tablet (180 mg total) by mouth daily. For allergy symptoms 30 tablet 11   fluticasone (FLONASE) 50 MCG/ACT nasal spray Place 2 sprays into both nostrils daily. 16 g 6   glucose blood (ACCU-CHEK GUIDE) test strip Check BS BID Dx E11.65 200 each 3   metFORMIN (GLUCOPHAGE-XR) 500 MG 24 hr tablet  Take 2 tablets (1,000 mg total) by mouth 2 (two) times daily with a meal. 120 tablet 2   nabumetone (RELAFEN) 500 MG tablet TAKE 2 TABLETS (1,000 MG TOTAL) BY MOUTH 2 (TWO) TIMES DAILY. FOR MUSCLE AND JOINT PAIN 360 tablet 0   rosuvastatin (CRESTOR) 20 MG tablet Take 1 tablet (20 mg total) by mouth daily. For cholesterol 90 tablet 3   No current facility-administered medications on file prior to visit.    ROS Review of Systems  Constitutional:  Negative for fever.  Respiratory:  Negative for shortness of breath.   Cardiovascular:  Negative for chest pain.  Musculoskeletal:  Negative for arthralgias.  Skin:  Negative for rash.    Objective:  BP (!) 150/81   Pulse 79   Temp (!) 97.5 F (36.4 C)   Ht _0  (1.93 m)   Wt 235 lb 12.8 oz (107 kg)   SpO2 98%   BMI 28.70 kg/m   BP Readings from Last 3 Encounters:  02/03/22 (!) 150/81  12/30/21 (!) 167/79  11/03/21 (!) 143/78    Wt Readings from Last 3 Encounters:  02/03/22 235 lb 12.8 oz (107 kg)  12/30/21 232 lb 9.6 oz (105.5 kg)  11/03/21 233 lb 12.8 oz (106.1 kg)     Physical Exam Vitals reviewed.  Constitutional:      Appearance: Matthew Thompson is well-developed.  HENT:     Head: Normocephalic and atraumatic.     Right Ear: External ear normal.     Left Ear: External ear normal.     Mouth/Throat:  Pharynx: No oropharyngeal exudate or posterior oropharyngeal erythema.  Eyes:     Pupils: Pupils are equal, round, and reactive to light.  Cardiovascular:     Rate and Rhythm: Normal rate and regular rhythm.     Heart sounds: No murmur heard. Pulmonary:     Effort: No respiratory distress.     Breath sounds: Normal breath sounds.  Musculoskeletal:     Cervical back: Normal range of motion and neck supple.  Neurological:     Mental Status: Matthew Thompson is alert and oriented to person, place, and time.    A1c = 10.0   Assessment & Plan:   Kayce was seen today for medical management of chronic issues.  Diagnoses and all orders  for this visit:  Type 2 diabetes mellitus with hyperglycemia, without long-term current use of insulin (HCC) -     Bayer DCA Hb A1c Waived -     CBC with Differential/Platelet -     CMP14+EGFR -     Microalbumin / creatinine urine ratio  Mixed hyperlipidemia -     Lipid panel  Other orders -     Dulaglutide (TRULICITY) 1.5 AY/8.4FU SOPN; Inject content of one pen under the skin weekly      I have discontinued [name]'s Trulicity. I am also having him start on Trulicity. Additionally, I am having him maintain his fluticasone, dapagliflozin propanediol, rosuvastatin, metFORMIN, fexofenadine, Accu-Chek Guide, Accu-Chek Softclix Lancets, and nabumetone.  Meds ordered this encounter  Medications   Dulaglutide (TRULICITY) 1.5 WT/2.1CC SOPN    Sig: Inject content of one pen under the skin weekly    Dispense:  2 mL    Refill:  1     Follow-up: Return in about 6 weeks (around 03/17/2022).  Claretta Fraise, M.D.

## 2022-02-04 LAB — CMP14+EGFR
ALT: 25 IU/L (ref 0–44)
AST: 14 IU/L (ref 0–40)
Albumin/Globulin Ratio: 2 (ref 1.2–2.2)
Albumin: 4.5 g/dL (ref 3.8–4.9)
Alkaline Phosphatase: 112 IU/L (ref 44–121)
BUN/Creatinine Ratio: 12 (ref 9–20)
BUN: 10 mg/dL (ref 6–24)
Bilirubin Total: 0.5 mg/dL (ref 0.0–1.2)
CO2: 22 mmol/L (ref 20–29)
Calcium: 9.4 mg/dL (ref 8.7–10.2)
Chloride: 102 mmol/L (ref 96–106)
Creatinine, Ser: 0.86 mg/dL (ref 0.76–1.27)
Globulin, Total: 2.2 g/dL (ref 1.5–4.5)
Glucose: 164 mg/dL — ABNORMAL HIGH (ref 70–99)
Potassium: 4.2 mmol/L (ref 3.5–5.2)
Sodium: 137 mmol/L (ref 134–144)
Total Protein: 6.7 g/dL (ref 6.0–8.5)
eGFR: 103 mL/min/{1.73_m2} (ref >59)

## 2022-02-04 LAB — CBC WITH DIFFERENTIAL/PLATELET
Basophils Absolute: 0 10*3/uL (ref 0.0–0.2)
Basos: 0 %
EOS (ABSOLUTE): 0.1 10*3/uL (ref 0.0–0.4)
Eos: 1 %
Hematocrit: 44.8 % (ref 37.5–51.0)
Hemoglobin: 15.5 g/dL (ref 13.0–17.7)
Immature Grans (Abs): 0 10*3/uL (ref 0.0–0.1)
Immature Granulocytes: 0 %
Lymphocytes Absolute: 2.4 10*3/uL (ref 0.7–3.1)
Lymphs: 23 %
MCH: 32.1 pg (ref 26.6–33.0)
MCHC: 34.6 g/dL (ref 31.5–35.7)
MCV: 93 fL (ref 79–97)
Monocytes Absolute: 1.5 10*3/uL — ABNORMAL HIGH (ref 0.1–0.9)
Monocytes: 13 %
Neutrophils Absolute: 6.8 10*3/uL (ref 1.4–7.0)
Neutrophils: 63 %
Platelets: 251 10*3/uL (ref 150–450)
RBC: 4.83 x10E6/uL (ref 4.14–5.80)
RDW: 13.2 % (ref 11.6–15.4)
WBC: 10.8 10*3/uL (ref 3.4–10.8)

## 2022-02-04 LAB — LIPID PANEL
Chol/HDL Ratio: 3.3 ratio (ref 0.0–5.0)
Cholesterol, Total: 123 mg/dL (ref 100–199)
HDL: 37 mg/dL — ABNORMAL LOW (ref >39)
LDL Chol Calc (NIH): 60 mg/dL (ref 0–99)
Triglycerides: 148 mg/dL (ref 0–149)
VLDL Cholesterol Cal: 26 mg/dL (ref 5–40)

## 2022-02-04 LAB — MICROALBUMIN / CREATININE URINE RATIO
Creatinine, Urine: 135 mg/dL
Microalb/Creat Ratio: 31 mg/g creat — ABNORMAL HIGH (ref 0–29)
Microalbumin, Urine: 41.6 ug/mL

## 2022-02-04 NOTE — Progress Notes (Signed)
Hello Indie,  Your lab result is normal and/or stable.Some minor variations that are not significant are commonly marked abnormal, but do not represent any medical problem for you.  Best regards, Claretta Fraise, M.D.

## 2022-02-11 ENCOUNTER — Ambulatory Visit: Payer: No Typology Code available for payment source | Admitting: Family Medicine

## 2022-02-17 ENCOUNTER — Other Ambulatory Visit: Payer: Self-pay | Admitting: Family Medicine

## 2022-02-17 DIAGNOSIS — E1165 Type 2 diabetes mellitus with hyperglycemia: Secondary | ICD-10-CM

## 2022-03-10 ENCOUNTER — Encounter: Payer: Self-pay | Admitting: Family Medicine

## 2022-03-10 ENCOUNTER — Ambulatory Visit (INDEPENDENT_AMBULATORY_CARE_PROVIDER_SITE_OTHER): Payer: No Typology Code available for payment source | Admitting: Family Medicine

## 2022-03-10 VITALS — BP 131/83 | HR 86 | Temp 97.9°F | Ht 76.0 in | Wt 230.0 lb

## 2022-03-10 DIAGNOSIS — E1169 Type 2 diabetes mellitus with other specified complication: Secondary | ICD-10-CM

## 2022-03-10 DIAGNOSIS — E756 Lipid storage disorder, unspecified: Secondary | ICD-10-CM

## 2022-03-10 MED ORDER — SILDENAFIL CITRATE 20 MG PO TABS: ORAL_TABLET | ORAL | 5 refills | Status: DC

## 2022-03-10 NOTE — Progress Notes (Signed)
Subjective:  Patient ID: Matthew Thompson, male    DOB: 05-26-1965  Age: 57 y.o. MRN: [id number]  CC: Medical Management of Chronic Issues   HPI Matthew Thompson presents forFollow-up of diabetes. Patient checks blood sugar at home.   88 fasting and 90-115 postprandial Patient denies symptoms such as polyuria, polydipsia, excessive hunger, nausea No significant hypoglycemic spells noted. Medications reviewed. Pt reports taking them regularly without complication/adverse reaction being reported today.    History Matthew Thompson has a past medical history of Diabetes mellitus without complication ([place]) and Hypertension.   He has a past surgical history that includes left hand and Hernia repair.   His family history includes Cancer in his father and mother; Diabetes in his brother, father, mother, and sister; Heart disease in his mother; Hypertension in his brother, father, mother, and sister; Stroke in his mother.He reports that he has been smoking cigarettes. He has a 10.50 pack-year smoking history. He has never used smokeless tobacco. He reports current alcohol use. He reports that he does not use drugs.  Current Outpatient Medications on File Prior to Visit  Medication Sig Dispense Refill   Accu-Chek Softclix Lancets lancets Check BS BID Dx E11.65 200 each 3   dapagliflozin propanediol (FARXIGA) 10 MG TABS tablet Take 1 tablet (10 mg total) by mouth daily before breakfast. 90 tablet 3   Dulaglutide (TRULICITY) 1.5 NI/6.2VO SOPN Inject content of one pen under the skin weekly 2 mL 1   fexofenadine (ALLEGRA) 180 MG tablet Take 1 tablet (180 mg total) by mouth daily. For allergy symptoms 30 tablet 11   fluticasone (FLONASE) 50 MCG/ACT nasal spray Place 2 sprays into both nostrils daily. 16 g 6   glucose blood (ACCU-CHEK GUIDE) test strip Check BS BID Dx E11.65 200 each 3   metFORMIN (GLUCOPHAGE-XR) 500 MG 24 hr tablet TAKE 2 TABLETS (1,000 MG TOTAL) BY MOUTH 2 (TWO) TIMES DAILY WITH A MEAL. 360  tablet 0   nabumetone (RELAFEN) 500 MG tablet TAKE 2 TABLETS (1,000 MG TOTAL) BY MOUTH 2 (TWO) TIMES DAILY. FOR MUSCLE AND JOINT PAIN 360 tablet 0   rosuvastatin (CRESTOR) 20 MG tablet Take 1 tablet (20 mg total) by mouth daily. For cholesterol 90 tablet 3   No current facility-administered medications on file prior to visit.    ROS Review of Systems  Constitutional:  Negative for fever.  Respiratory:  Negative for shortness of breath.   Cardiovascular:  Negative for chest pain.  Musculoskeletal:  Negative for arthralgias.  Skin:  Negative for rash.    Objective:  BP 131/83   Pulse 86   Temp 97.9 F (36.6 C)   Ht 6\' 4"  (1.93 m)   Wt 230 lb (104.3 kg)   SpO2 98%   BMI 28.00 kg/m   BP Readings from Last 3 Encounters:  03/10/22 131/83  02/03/22 (!) 150/81  12/30/21 (!) 167/79    Wt Readings from Last 3 Encounters:  03/10/22 230 lb (104.3 kg)  02/03/22 235 lb 12.8 oz (107 kg)  12/30/21 232 lb 9.6 oz (105.5 kg)     Physical Exam Vitals reviewed.  Constitutional:      Appearance: He is well-developed.  HENT:     Head: Normocephalic and atraumatic.     Right Ear: External ear normal.     Left Ear: External ear normal.     Mouth/Throat:     Pharynx: No oropharyngeal exudate or posterior oropharyngeal erythema.  Eyes:     Pupils: Pupils are equal,  round, and reactive to light.  Cardiovascular:     Rate and Rhythm: Normal rate and regular rhythm.     Heart sounds: No murmur heard. Pulmonary:     Effort: No respiratory distress.     Breath sounds: Normal breath sounds.  Musculoskeletal:     Cervical back: Normal range of motion and neck supple.  Neurological:     Mental Status: He is alert and oriented to person, place, and time.       Assessment & Plan:   Matthew Thompson was seen today for medical management of chronic issues.  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 I am also having him maintain his fluticasone, dapagliflozin propanediol, rosuvastatin, fexofenadine, Accu-Chek Guide, Accu-Chek Softclix Lancets, nabumetone, Trulicity, and metFORMIN.  Meds ordered this encounter  Medications   sildenafil (REVATIO) 20 MG tablet    Sig: Take 2-5 pills at once, orally, with each sexual encounter    Dispense:  50 tablet    Refill:  5     Follow-up: Return in about 2 months (around 05/09/2022).  Claretta Fraise, M.D.

## 2022-03-13 ENCOUNTER — Telehealth: Payer: Self-pay

## 2022-03-13 NOTE — Telephone Encounter (Signed)
Transition Care Management Follow-up Telephone Call Date of discharge and from where: [facility] How have you been since you were released from the hospital? better Any questions or concerns? No  Items Reviewed: Did the pt receive and understand the discharge instructions provided? Yes  Medications obtained and verified? Yes  Other? No  Any new allergies since your discharge? No  Dietary orders reviewed? Yes Do you have support at home? Yes   Home Care and Equipment/Supplies: Were home health services ordered? no If so, what is the name of the agency? N/a  Has the agency set up a time to come to the patient's home? no Were any new equipment or medical supplies ordered?  No What is the name of the medical supply agency? N/a Were you able to get the supplies/equipment? not applicable Do you have any questions related to the use of the equipment or supplies? No  Functional Questionnaire: (I = Independent and D = Dependent) ADLs: I  Bathing/Dressing- I  Meal Prep- I  Eating- I  Maintaining continence- I  Transferring/Ambulation- I  Managing Meds- I  Follow up appointments reviewed:  PCP Hospital f/u appt confirmed? No   Specialist Hospital f/u appt confirmed? No   Are transportation arrangements needed? No  If their condition worsens, is the pt aware to call PCP or go to the Emergency Dept.? Yes Was the patient provided with contact information for the PCP's office or ED? Yes Was to pt encouraged to call back with questions or concerns? Yes  Juanda Crumble, LPN [facility] Direct Dial [phone]

## 2022-04-08 ENCOUNTER — Ambulatory Visit: Payer: No Typology Code available for payment source | Admitting: Family Medicine

## 2022-04-27 ENCOUNTER — Other Ambulatory Visit: Payer: Self-pay | Admitting: Family Medicine

## 2022-04-27 ENCOUNTER — Encounter: Payer: Self-pay | Admitting: Family Medicine

## 2022-05-11 ENCOUNTER — Ambulatory Visit: Payer: No Typology Code available for payment source | Admitting: Family Medicine

## 2022-05-17 ENCOUNTER — Other Ambulatory Visit: Payer: Self-pay | Admitting: Family Medicine

## 2022-05-17 DIAGNOSIS — E1165 Type 2 diabetes mellitus with hyperglycemia: Secondary | ICD-10-CM

## 2022-05-22 ENCOUNTER — Other Ambulatory Visit: Payer: Self-pay | Admitting: Family Medicine

## 2022-05-28 IMAGING — MR MR PELVIS WO/W CM
16 series · 48 of 48 positions shown · IV contrast (gadavist)
Comparison: Scrotal ultrasound, 12/07/2019

CLINICAL DATA: Scrotal pain, soft tissue infection suspected

EXAM:
MRI PELVIS WITHOUT AND WITH CONTRAST
TECHNIQUE: Multiplanar multisequence MR imaging of the pelvis was performed
both before and after administration of intravenous contrast.
CONTRAST:  10mL GADAVIST GADOBUTROL 1 MMOL/ML IV SOLN

[Series 3: T1 · axial · 5.0mm · 1.45mm/px · z∈[-31,+263]mm · 2 of 50 slices shown]
[im 1/50]
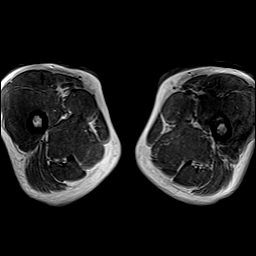
[im 50/50]
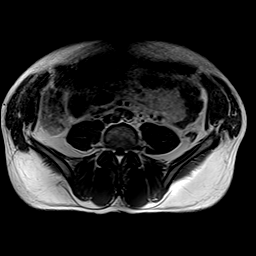

[Series 4: T2 · axial · 5.0mm · 1.45mm/px · z∈[-31,+263]mm · 2 of 50 slices shown (1 of 4)]
[im 1/50]
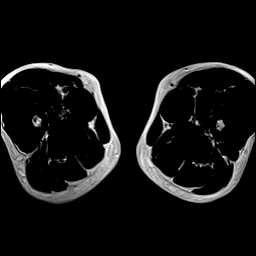
[im 50/50]
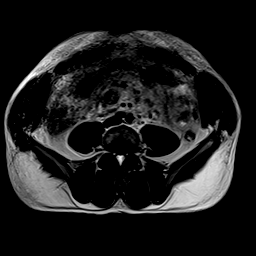

[Series 5: T2 fat-sat · axial · 5.0mm · 1.45mm/px · z∈[-31,+263]mm · 2 of 50 slices shown (1 of 3)]
[im 1/50]
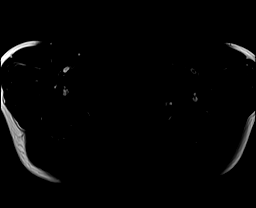
[im 50/50]
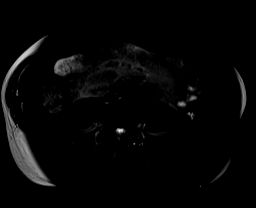

[Series 6: DWI · axial · 8.0mm · 1.41mm/px · z∈[-29,+261]mm · 2 of 30 slices shown (1 of 2)]
[im 1/30]
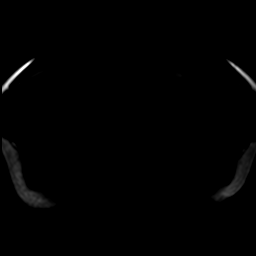
[im 30/30]
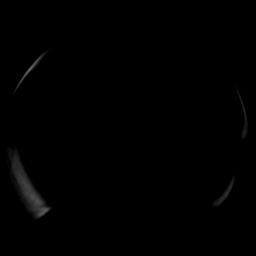

[Series 6: DWI · axial · 8.0mm · 1.41mm/px · z∈[-29,+261]mm · 2 of 30 slices shown (2 of 2)]
[im 1/30]
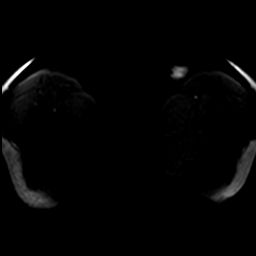
[im 30/30]
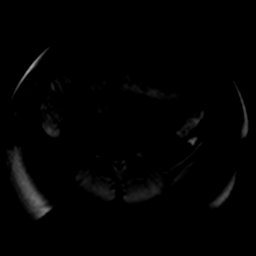

[Series 7: ax dwi_adc · axial · 8.0mm · 1.41mm/px · z∈[-29,+261]mm · 2 of 30 slices shown]
[im 1/30]
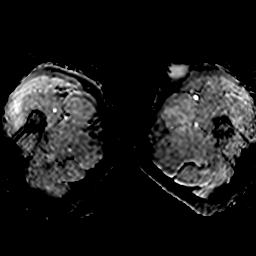
[im 30/30]
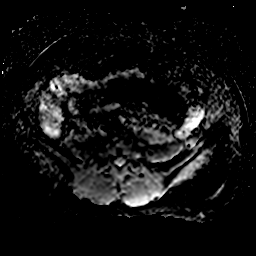

[Series 8: ax dwi_calc_bval · axial · 8.0mm · 1.41mm/px · z∈[-29,+261]mm · 2 of 30 slices shown]
[im 1/30]
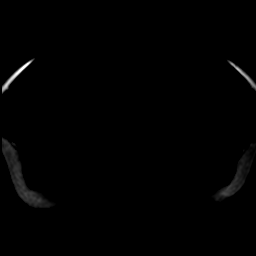
[im 30/30]
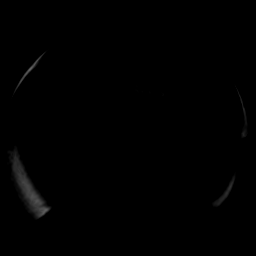

[Series 9: T2 · coronal · 4.0mm · 0.62mm/px · 1 of 23 slices shown (2 of 4)]
[im 1/23]
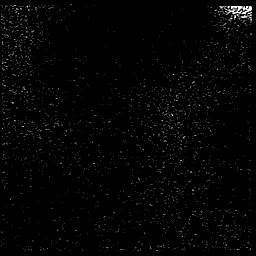

[Series 10: T2 · axial · 4.0mm · 0.62mm/px · z∈[-36,+116]mm · 2 of 35 slices shown (3 of 4)]
[im 1/35]
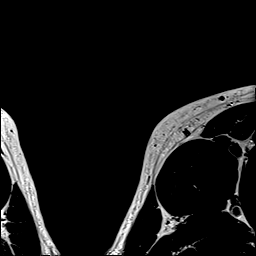
[im 35/35]
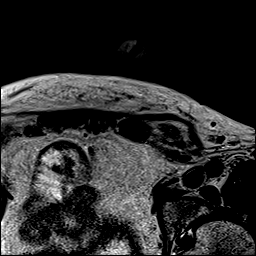

[Series 11: T2 · sagittal · 4.0mm · 0.62mm/px · 2 of 35 slices shown (4 of 4)]
[im 1/35]
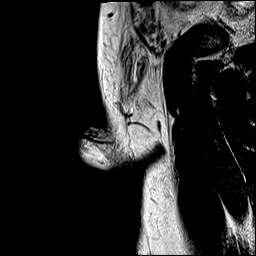
[im 35/35]
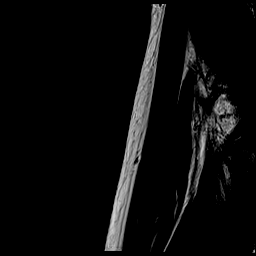

[Series 12: T2 fat-sat · sagittal · 4.0mm · 0.62mm/px · 2 of 35 slices shown (2 of 3)]
[im 1/35]
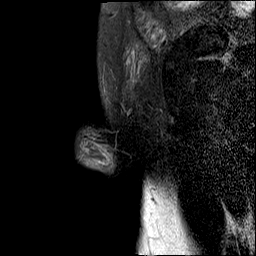
[im 35/35]
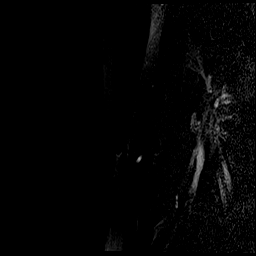

[Series 13: T2 fat-sat · axial · 4.0mm · 0.62mm/px · z∈[-36,+116]mm · 2 of 35 slices shown (3 of 3)]
[im 1/35]
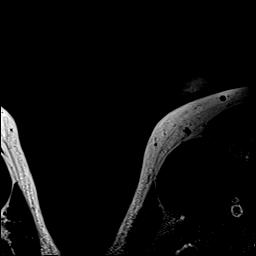
[im 35/35]
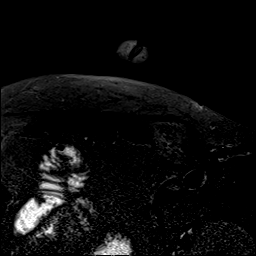

[Series 15: T1 fat-sat · axial · 3.0mm · 1.19mm/px · z∈[-39,+270]mm · 6 of 104 slices shown (1 of 4)]
[im 1/104]
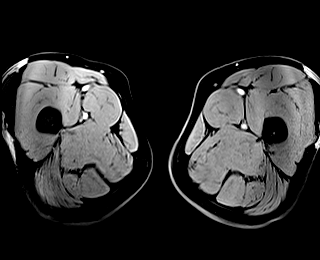
[im 21/104]
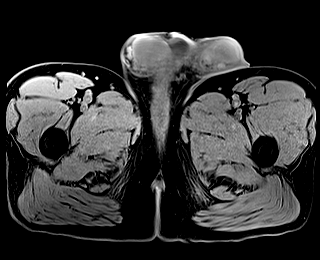
[im 42/104]
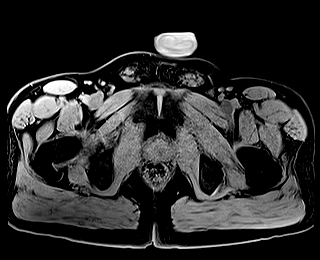
[im 62/104]
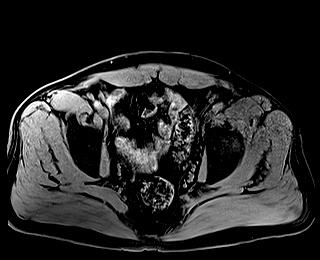
[im 83/104]
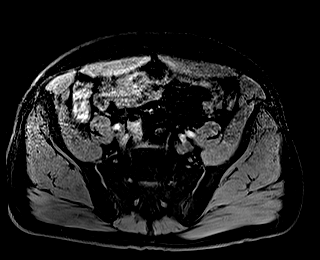
[im 104/104]
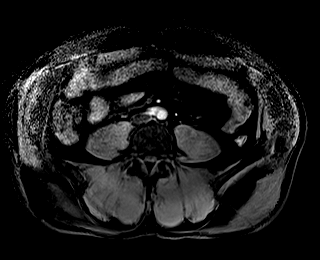

[Series 18: T1 fat-sat · axial · 3.0mm · 1.19mm/px · z∈[-39,+270]mm · 6 of 104 slices shown (2 of 4)]
[im 1/104]
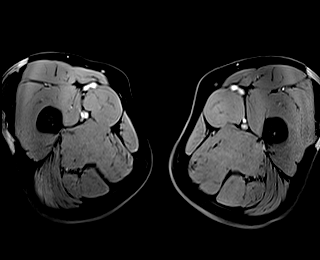
[im 21/104]
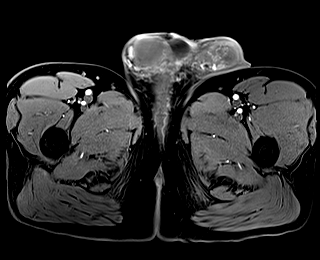
[im 42/104]
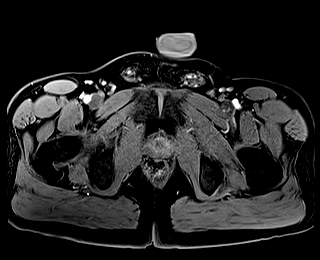
[im 62/104]
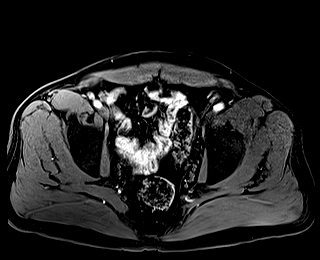
[im 83/104]
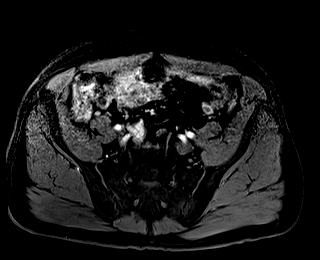
[im 104/104]
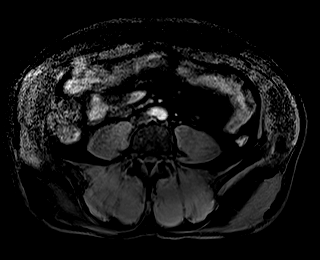

[Series 19: T1 fat-sat · axial · 3.0mm · 1.19mm/px · z∈[-39,+270]mm · 6 of 104 slices shown (3 of 4)]
[im 1/104]
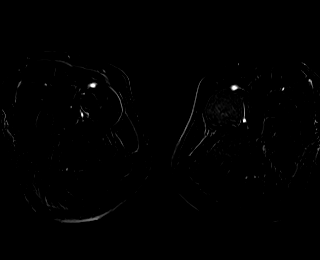
[im 21/104]
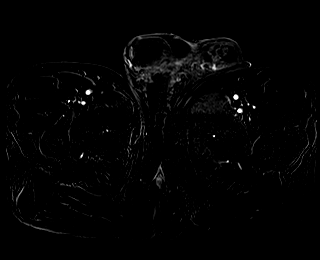
[im 42/104]
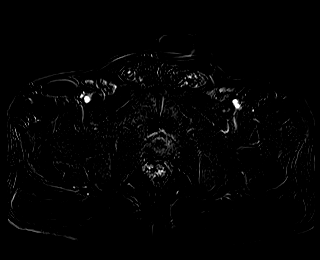
[im 62/104]
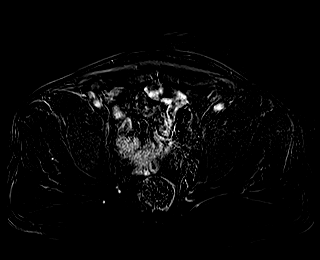
[im 83/104]
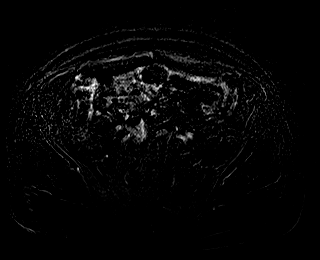
[im 104/104]
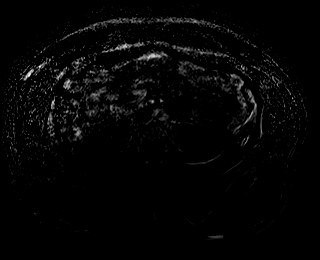

[Series 21: T1 fat-sat · sagittal · 1.6mm · 1.04mm/px · 7 of 128 slices shown (4 of 4)]
[im 1/128]
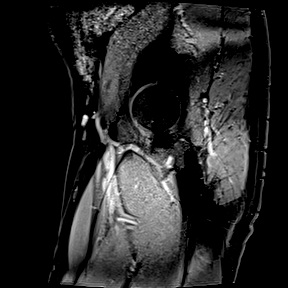
[im 22/128]
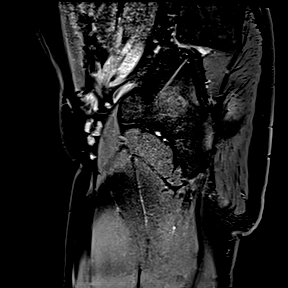
[im 43/128]
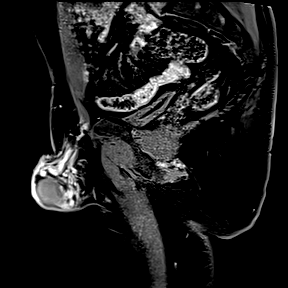
[im 64/128]
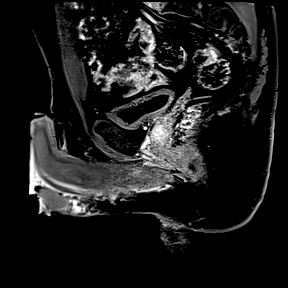
[im 85/128]
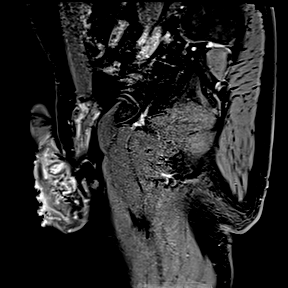
[im 106/128]
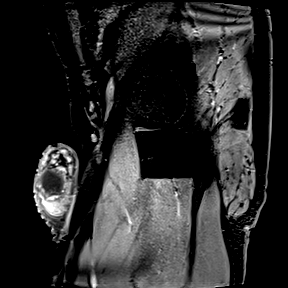
[im 128/128]
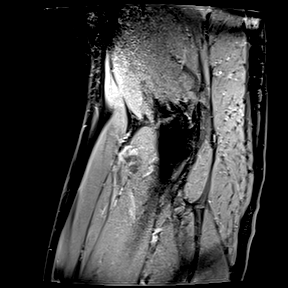

[48 of 48 positions shown; findings below may reference images not displayed]

FINDINGS: Urinary Tract:  Mild thickening of the urinary bladder.

Bowel:  Unremarkable visualized pelvic bowel loops.

Vascular/Lymphatic: No pathologically enlarged lymph nodes. No
significant vascular abnormality seen.

Reproductive: The left testicle is asymmetrically small compared to
the right. There slight hypoenhancement of the left testicle. The
left epididymis is slightly enlarged and hyperenhancing relative to
the right (series 21, image 105). Small, nonspecific right
hydrocele.

Other:  None.

Musculoskeletal: No suspicious bone lesions identified.
IMPRESSION: 1. The left testicle is asymmetrically small compared to the right,
in keeping with findings of prior ultrasound and perhaps a
developmental variant or stigmata of prior infection or other
insult.
2. There is slight contrast hypoenhancement of the left testicle,
again of uncertain significance. Doppler ultrasound is the test of
choice to assess for testicular torsion if clinically suspected.
3. The left epididymis is slightly enlarged and hyperenhancing
relative to the right, generally consistent with epididymitis.
4. Small, nonspecific right hydrocele.
5. Mild thickening of the urinary bladder, which may be related to
chronic outlet obstruction or alternately infectious or inflammatory
cystitis. Correlate with urinalysis.

## 2022-06-15 ENCOUNTER — Ambulatory Visit: Payer: No Typology Code available for payment source | Admitting: Family Medicine

## 2022-06-15 ENCOUNTER — Encounter: Payer: Self-pay | Admitting: Family Medicine

## 2022-08-10 ENCOUNTER — Telehealth: Payer: Self-pay

## 2022-08-10 NOTE — Transitions of Care (Post Inpatient/ED Visit) (Signed)
   08/10/2022  Name: Matthew Thompson MRN: [id number] DOB: Jul 22, 1965  Today's TOC FU Call Status: Today's TOC FU Call Status:: Successful TOC FU Call Competed TOC FU Call Complete Date: 08/10/22  Transition Care Management Follow-up Telephone Call Date of Discharge: 08/06/22 Discharge Facility: Other ([facility]) Name of Other ([facility]) Discharge Facility: [facility] Type of Discharge: Emergency Department Reason for ED Visit: Other: ([place]) How have you been since you were released from the hospital?: Better Any questions or concerns?: No  Items Reviewed: Did you receive and understand the discharge instructions provided?: Yes Medications obtained,verified, and reconciled?: Yes (Medications Reviewed) Any new allergies since your discharge?: No Dietary orders reviewed?: Yes Do you have support at home?: Yes People in Home: spouse  Medications Reviewed Today: Medications Reviewed Today     Reviewed by Karena Addison, LPN (Licensed Practical Nurse) on 08/10/22 at 1429  Med List Status: <None>   Medication Order Taking? Sig Documenting Provider Last Dose Status Informant  Accu-Chek Softclix Lancets lancets [id number] No Check BS BID Dx E11.65 Mechele Claude, MD Taking Active   dapagliflozin propanediol (FARXIGA) 10 MG TABS tablet [id number] No Take 1 tablet (10 mg total) by mouth daily before breakfast. Mechele Claude, MD Taking Active   Dulaglutide (TRULICITY) 1.5 MG/0.5ML Namon Cirri [id number]  INJECT 1.5MG  UNDER THE SKIN WEEKLY Mechele Claude, MD  Active   fexofenadine (ALLEGRA) 180 MG tablet [id number] No Take 1 tablet (180 mg total) by mouth daily. For allergy symptoms Mechele Claude, MD Taking Active   fluticasone [facility]) 50 MCG/ACT nasal spray [id number] No Place 2 sprays into both nostrils daily. Junie Spencer, FNP Taking Active   glucose blood (ACCU-CHEK GUIDE) test strip [id number] No Check BS BID Dx E11.65 Mechele Claude, MD Taking Active   metFORMIN (GLUCOPHAGE-XR) 500 MG  24 hr tablet [id number]  TAKE 2 TABLETS (1,000 MG TOTAL) BY MOUTH 2 (TWO) TIMES DAILY WITH A MEAL. Mechele Claude, MD  Active   nabumetone (RELAFEN) 500 MG tablet [id number]  TAKE 2 TABLETS (1,000 MG TOTAL) BY MOUTH 2 (TWO) TIMES DAILY. FOR MUSCLE AND JOINT PAIN Mechele Claude, MD  Active   rosuvastatin (CRESTOR) 20 MG tablet [id number] No Take 1 tablet (20 mg total) by mouth daily. For cholesterol Mechele Claude, MD Taking Active   sildenafil (REVATIO) 20 MG tablet [id number]  Take 2-5 pills at once, orally, with each sexual encounter Mechele Claude, MD  Active             Home Care and Equipment/Supplies: Were Home Health Services Ordered?: NA Any new equipment or medical supplies ordered?: NA  Functional Questionnaire: Do you need assistance with bathing/showering or dressing?: No Do you need assistance with meal preparation?: No Do you need assistance with eating?: No Do you have difficulty maintaining continence: No Do you need assistance with getting out of bed/getting out of a chair/moving?: No Do you have difficulty managing or taking your medications?: No  Follow up appointments reviewed: PCP Follow-up appointment confirmed?: No (no avail appts within 7 days, sent message to staff to schedule) MD Provider Line Number:[phone] Given: No Specialist Hospital Follow-up appointment confirmed?: NA Do you need transportation to your follow-up appointment?: No Do you understand care options if your condition(s) worsen?: Yes-patient verbalized understanding    SIGNATURE Karena Addison, LPN [facility] Nurse Health Advisor Direct Dial [phone]

## 2022-10-21 ENCOUNTER — Other Ambulatory Visit: Payer: Self-pay | Admitting: Family Medicine

## 2022-11-02 ENCOUNTER — Other Ambulatory Visit: Payer: Self-pay | Admitting: Family Medicine

## 2022-11-04 ENCOUNTER — Other Ambulatory Visit: Payer: Self-pay | Admitting: Family Medicine

## 2022-11-04 NOTE — Telephone Encounter (Signed)
Appt made for 11/18/22

## 2022-11-04 NOTE — Telephone Encounter (Signed)
Stacks pt NTBS 30-d given 10/21/22

## 2022-11-17 ENCOUNTER — Telehealth: Payer: Self-pay

## 2022-11-17 NOTE — Telephone Encounter (Signed)
Matthew Thompson ([name]) PA Case ID #: [id number] Rx #: [id number] Need Help? Call us at [phone] Status sent iconSent to Plan today Next Steps The plan will fax you a determination, typically within 1 to 5 business days. Drug Trulicity 1.5MG /0.5ML auto-injectors ePA cloud Optician, dispensing PA Form [phone] NCPDP) Original Claim Info 71 MUST MEET STEP OR MD CALL [phone] GX METFORMIN, MOUNJARO, OZEMPIC,RYBELSUS, TRULICITY, VICTOZA OR EXCDRUG REQUIRES PRIOR AUTHORIZATION(PHARMACY HELP DESK [phone])

## 2022-11-18 ENCOUNTER — Ambulatory Visit: Payer: No Typology Code available for payment source | Admitting: Family Medicine

## 2022-11-18 DIAGNOSIS — J301 Allergic rhinitis due to pollen: Secondary | ICD-10-CM

## 2022-11-18 DIAGNOSIS — Z125 Encounter for screening for malignant neoplasm of prostate: Secondary | ICD-10-CM

## 2022-11-18 DIAGNOSIS — E782 Mixed hyperlipidemia: Secondary | ICD-10-CM

## 2022-11-18 DIAGNOSIS — E1165 Type 2 diabetes mellitus with hyperglycemia: Secondary | ICD-10-CM

## 2022-11-18 NOTE — Telephone Encounter (Signed)
Pharmacy Patient Advocate Encounter  Received notification from [facility] that Prior Authorization for Trulicity 1.5MG /0.5ML auto-injectors has been APPROVED from 11/17/22 to 11/16/25   PA #/Case ID/Reference #:  [id number]

## 2022-11-19 ENCOUNTER — Ambulatory Visit (INDEPENDENT_AMBULATORY_CARE_PROVIDER_SITE_OTHER): Payer: No Typology Code available for payment source | Admitting: Family Medicine

## 2022-11-19 ENCOUNTER — Encounter: Payer: Self-pay | Admitting: Family Medicine

## 2022-11-19 VITALS — BP 152/72 | HR 77 | Temp 97.7°F | Ht 76.0 in | Wt 234.4 lb

## 2022-11-19 DIAGNOSIS — E1169 Type 2 diabetes mellitus with other specified complication: Secondary | ICD-10-CM

## 2022-11-19 DIAGNOSIS — Z7984 Long term (current) use of oral hypoglycemic drugs: Secondary | ICD-10-CM

## 2022-11-19 DIAGNOSIS — E782 Mixed hyperlipidemia: Secondary | ICD-10-CM

## 2022-11-19 DIAGNOSIS — E1165 Type 2 diabetes mellitus with hyperglycemia: Secondary | ICD-10-CM | POA: Diagnosis not present

## 2022-11-19 DIAGNOSIS — E756 Lipid storage disorder, unspecified: Secondary | ICD-10-CM

## 2022-11-19 DIAGNOSIS — Z125 Encounter for screening for malignant neoplasm of prostate: Secondary | ICD-10-CM | POA: Diagnosis not present

## 2022-11-19 DIAGNOSIS — M5416 Radiculopathy, lumbar region: Secondary | ICD-10-CM

## 2022-11-19 NOTE — Progress Notes (Signed)
Subjective:  Patient ID: Matthew Thompson, male    DOB: 1965/04/06  Age: 57 y.o. MRN: [id number]  CC: Medical Management of Chronic Issues   HPI Matthew Thompson presents forFollow-up of diabetes. Patient checks blood sugar at home.   100-123 fasting and the same postprandial Patient denies symptoms such as polyuria, polydipsia, excessive hunger, nausea No significant hypoglycemic spells noted. Medications reviewed. Matthew Thompson reports taking them regularly without complication/adverse reaction being reported today.    History Matthew Thompson has a past medical history of Diabetes mellitus without complication (HCC) and Hypertension.   Matthew Thompson has a past surgical history that includes left hand and Hernia repair.   His family history includes Cancer in his father and mother; Diabetes in his brother, father, mother, and sister; Heart disease in his mother; Hypertension in his brother, father, mother, and sister; Stroke in his mother.Matthew Thompson reports that Matthew Thompson has been smoking cigarettes. Matthew Thompson has a 10.5 pack-year smoking history. Matthew Thompson has never used smokeless tobacco. Matthew Thompson reports current alcohol use. Matthew Thompson reports that Matthew Thompson does not use drugs.  Current Outpatient Medications on File Prior to Visit  Medication Sig Dispense Refill   Accu-Chek Softclix Lancets lancets Check BS BID Dx E11.65 200 each 3   dapagliflozin propanediol (FARXIGA) 10 MG TABS tablet Take 1 tablet (10 mg total) by mouth daily before breakfast. 90 tablet 3   Dulaglutide (TRULICITY) 1.5 MG/0.5ML SOPN INJECT 1.5MG  UNDER THE SKIN WEEKLY **NEEDS TO BE SEEN BEFORE NEXT REFILL** 2 mL 0   fexofenadine (ALLEGRA) 180 MG tablet Take 1 tablet (180 mg total) by mouth daily. For allergy symptoms 30 tablet 11   fluticasone (FLONASE) 50 MCG/ACT nasal spray Place 2 sprays into both nostrils daily. 16 g 6   glucose blood (ACCU-CHEK GUIDE) test strip Check BS BID Dx E11.65 200 each 3   metFORMIN (GLUCOPHAGE-XR) 500 MG 24 hr tablet TAKE 2 TABLETS (1,000 MG TOTAL) BY MOUTH 2 (TWO)  TIMES DAILY WITH A MEAL. 360 tablet 0   rosuvastatin (CRESTOR) 20 MG tablet Take 1 tablet (20 mg total) by mouth daily. **NEEDS TO BE SEEN BEFORE NEXT REFILL** 30 tablet 0   sildenafil (REVATIO) 20 MG tablet Take 2-5 pills at once, orally, with each sexual encounter 50 tablet 5   No current facility-administered medications on file prior to visit.    ROS Review of Systems  Constitutional:  Negative for fever.  Respiratory:  Negative for shortness of breath.   Cardiovascular:  Negative for chest pain.  Musculoskeletal:  Positive for back pain (using gaba prn). Negative for arthralgias.  Skin:  Negative for rash.    Objective:  BP (!) 152/72   Pulse 77   Temp 97.7 F (36.5 C)   Ht 6\' 4"  (1.93 m)   Wt 234 lb 6.4 oz (106.3 kg)   SpO2 97%   BMI 28.53 kg/m   BP Readings from Last 3 Encounters:  11/19/22 (!) 152/72  03/10/22 131/83  02/03/22 (!) 150/81    Wt Readings from Last 3 Encounters:  11/19/22 234 lb 6.4 oz (106.3 kg)  03/10/22 230 lb (104.3 kg)  02/03/22 235 lb 12.8 oz (107 kg)     Physical Exam    Assessment & Plan:   Matthew Thompson was seen today for medical management of chronic issues.  Diagnoses and all orders for this visit:  Type 2 diabetes mellitus with hyperglycemia, without long-term current use of insulin (HCC) -     Bayer DCA Hb A1c Waived -  CBC with Differential/Platelet -     CMP14+EGFR  Diabetic lipidosis (HCC) -     Lipid panel  Mixed hyperlipidemia -     Lipid panel  Prostate cancer screening -     PSA, total and free  Right lumbar radiculopathy      I have discontinued Matthew Thompson's nabumetone. I am also having him maintain his fluticasone, dapagliflozin propanediol, fexofenadine, Accu-Chek Guide, Accu-Chek Softclix Lancets, sildenafil, metFORMIN, rosuvastatin, and Trulicity.  No orders of the defined types were placed in this encounter.    Follow-up: Return in about 3 months (around 02/19/2023).  Mechele Claude, M.D.

## 2022-11-20 LAB — CBC WITH DIFFERENTIAL/PLATELET
Basophils Absolute: 0.1 10*3/uL (ref 0.0–0.2)
Basos: 1 %
EOS (ABSOLUTE): 0.2 10*3/uL (ref 0.0–0.4)
Eos: 2 %
Hematocrit: 42.2 % (ref 37.5–51.0)
Hemoglobin: 14.6 g/dL (ref 13.0–17.7)
Immature Grans (Abs): 0 10*3/uL (ref 0.0–0.1)
Immature Granulocytes: 0 %
Lymphocytes Absolute: 3.3 10*3/uL — ABNORMAL HIGH (ref 0.7–3.1)
Lymphs: 48 %
MCH: 32.4 pg (ref 26.6–33.0)
MCHC: 34.6 g/dL (ref 31.5–35.7)
MCV: 94 fL (ref 79–97)
Monocytes Absolute: 1.2 10*3/uL — ABNORMAL HIGH (ref 0.1–0.9)
Monocytes: 17 %
Neutrophils Absolute: 2.2 10*3/uL (ref 1.4–7.0)
Neutrophils: 32 %
Platelets: 252 10*3/uL (ref 150–450)
RBC: 4.5 x10E6/uL (ref 4.14–5.80)
RDW: 13 % (ref 11.6–15.4)
WBC: 7 10*3/uL (ref 3.4–10.8)

## 2022-11-20 LAB — CMP14+EGFR
ALT: 22 [IU]/L (ref 0–44)
AST: 14 [IU]/L (ref 0–40)
Albumin: 4.4 g/dL (ref 3.8–4.9)
Alkaline Phosphatase: 102 [IU]/L (ref 44–121)
BUN/Creatinine Ratio: 12 (ref 9–20)
BUN: 12 mg/dL (ref 6–24)
Bilirubin Total: 0.5 mg/dL (ref 0.0–1.2)
CO2: 22 mmol/L (ref 20–29)
Calcium: 9.5 mg/dL (ref 8.7–10.2)
Chloride: 105 mmol/L (ref 96–106)
Creatinine, Ser: 1 mg/dL (ref 0.76–1.27)
Globulin, Total: 2.5 g/dL (ref 1.5–4.5)
Glucose: 108 mg/dL — ABNORMAL HIGH (ref 70–99)
Potassium: 3.8 mmol/L (ref 3.5–5.2)
Sodium: 142 mmol/L (ref 134–144)
Total Protein: 6.9 g/dL (ref 6.0–8.5)
eGFR: 89 mL/min/{1.73_m2} (ref >59)

## 2022-11-20 LAB — LIPID PANEL
Chol/HDL Ratio: 3.5 {ratio} (ref 0.0–5.0)
Cholesterol, Total: 117 mg/dL (ref 100–199)
HDL: 33 mg/dL — ABNORMAL LOW (ref >39)
LDL Chol Calc (NIH): 46 mg/dL (ref 0–99)
Triglycerides: 242 mg/dL — ABNORMAL HIGH (ref 0–149)
VLDL Cholesterol Cal: 38 mg/dL (ref 5–40)

## 2022-11-20 LAB — PSA, TOTAL AND FREE
PSA, Free Pct: 25 %
PSA, Free: 0.05 ng/mL
Prostate Specific Ag, Serum: 0.2 ng/mL (ref 0.0–4.0)

## 2022-11-20 LAB — BAYER DCA HB A1C WAIVED: HB A1C (BAYER DCA - WAIVED): 7.9 % — ABNORMAL HIGH (ref 4.8–5.6)

## 2022-11-22 NOTE — Progress Notes (Signed)
Hello [place],  Your lab result is normal and/or stable.Some minor variations that are not significant are commonly marked abnormal, but do not represent any medical problem for you.  Best regards, Parry Po, M.D.

## 2022-12-22 ENCOUNTER — Ambulatory Visit: Payer: No Typology Code available for payment source

## 2022-12-22 ENCOUNTER — Other Ambulatory Visit: Payer: Self-pay | Admitting: Family Medicine

## 2023-01-08 ENCOUNTER — Other Ambulatory Visit: Payer: Self-pay | Admitting: Family Medicine

## 2023-02-18 ENCOUNTER — Ambulatory Visit: Payer: No Typology Code available for payment source | Admitting: Family Medicine

## 2023-02-18 ENCOUNTER — Encounter: Payer: Self-pay | Admitting: Family Medicine

## 2023-02-24 ENCOUNTER — Other Ambulatory Visit: Payer: Self-pay | Admitting: Family Medicine

## 2023-04-06 ENCOUNTER — Encounter: Payer: Self-pay | Admitting: Family Medicine

## 2023-04-06 ENCOUNTER — Other Ambulatory Visit: Payer: Self-pay | Admitting: Family Medicine

## 2023-04-06 NOTE — Telephone Encounter (Signed)
 Stacks NTBS in April for 6 mos FU RF sent to pharmacy

## 2023-04-06 NOTE — Telephone Encounter (Signed)
Lmtcb to schedule apt. Letter mailed

## 2023-04-07 ENCOUNTER — Other Ambulatory Visit: Payer: Self-pay | Admitting: Family Medicine

## 2023-04-07 DIAGNOSIS — E1165 Type 2 diabetes mellitus with hyperglycemia: Secondary | ICD-10-CM

## 2023-05-10 ENCOUNTER — Ambulatory Visit (INDEPENDENT_AMBULATORY_CARE_PROVIDER_SITE_OTHER): Admitting: Family Medicine

## 2023-05-10 ENCOUNTER — Ambulatory Visit (INDEPENDENT_AMBULATORY_CARE_PROVIDER_SITE_OTHER)

## 2023-05-10 VITALS — BP 146/76 | HR 80 | Temp 98.6°F | Ht 76.0 in | Wt 237.0 lb

## 2023-05-10 DIAGNOSIS — L723 Sebaceous cyst: Secondary | ICD-10-CM

## 2023-05-10 DIAGNOSIS — E1169 Type 2 diabetes mellitus with other specified complication: Secondary | ICD-10-CM

## 2023-05-10 DIAGNOSIS — I10 Essential (primary) hypertension: Secondary | ICD-10-CM | POA: Insufficient documentation

## 2023-05-10 DIAGNOSIS — M79601 Pain in right arm: Secondary | ICD-10-CM

## 2023-05-10 DIAGNOSIS — E782 Mixed hyperlipidemia: Secondary | ICD-10-CM

## 2023-05-10 DIAGNOSIS — E756 Lipid storage disorder, unspecified: Secondary | ICD-10-CM

## 2023-05-10 DIAGNOSIS — E1165 Type 2 diabetes mellitus with hyperglycemia: Secondary | ICD-10-CM | POA: Diagnosis not present

## 2023-05-10 DIAGNOSIS — J301 Allergic rhinitis due to pollen: Secondary | ICD-10-CM | POA: Diagnosis not present

## 2023-05-10 DIAGNOSIS — Z7984 Long term (current) use of oral hypoglycemic drugs: Secondary | ICD-10-CM

## 2023-05-10 LAB — LIPID PANEL

## 2023-05-10 LAB — BAYER DCA HB A1C WAIVED: HB A1C (BAYER DCA - WAIVED): 7 % — ABNORMAL HIGH (ref 4.8–5.6)

## 2023-05-10 MED ORDER — ROSUVASTATIN CALCIUM 20 MG PO TABS: 20.0000 mg | ORAL_TABLET | Freq: Every day | ORAL | 0 refills | Status: DC

## 2023-05-10 MED ORDER — LISINOPRIL 10 MG PO TABS: 10.0000 mg | ORAL_TABLET | Freq: Every day | ORAL | 3 refills | Status: DC

## 2023-05-10 MED ORDER — FEXOFENADINE HCL 180 MG PO TABS: 180.0000 mg | ORAL_TABLET | Freq: Every day | ORAL | 11 refills | Status: DC

## 2023-05-10 MED ORDER — PREDNISONE 10 MG PO TABS: ORAL_TABLET | ORAL | 0 refills | Status: DC

## 2023-05-10 MED ORDER — TRULICITY 1.5 MG/0.5ML ~~LOC~~ SOAJ: SUBCUTANEOUS | 2 refills | Status: DC

## 2023-05-10 MED ORDER — METFORMIN HCL ER 500 MG PO TB24
500.0000 mg | ORAL_TABLET | Freq: Two times a day (BID) | ORAL | 3 refills | Status: DC
Start: 2023-05-10 — End: 2023-07-22

## 2023-05-10 NOTE — Progress Notes (Signed)
 Subjective:  Patient ID: Matthew Thompson,  male    DOB: 18-Jan-1966  Age: 58 y.o.    CC: Medical Management of Chronic Issues (No concerns at this time. ) and Cyst (Knot on back. When he sweats it gets bigger and then goes back down. Sometimes drains. )   HPI Matthew Thompson presents for  follow-up of hypertension. Patient has no history of headache chest pain or shortness of breath or recent cough. Patient also denies symptoms of TIA such as numbness weakness lateralizing. Patient denies side effects from medication. States taking it regularly.  Patient also  in for follow-up of elevated cholesterol. Doing well without complaints on current medication. Denies side effects  including myalgia and arthralgia and nausea. Also in today for liver function testing. Currently no chest pain, shortness of breath or other cardiovascular related symptoms noted.  Follow-up of diabetes. Patient does check blood sugar at home. Readings run between 90 and 130 Patient denies symptoms such as excessive hunger or urinary frequency, excessive hunger, nausea No significant hypoglycemic spells noted. Medications reviewed. Pt reports taking them regularly. Pt. denies complication/adverse reaction today.    History Joseth has a past medical history of Diabetes mellitus without complication (HCC) and Hypertension.   He has a past surgical history that includes left hand and Hernia repair.   His family history includes Cancer in his father and mother; Diabetes in his brother, father, mother, and sister; Heart disease in his mother; Hypertension in his brother, father, mother, and sister; Stroke in his mother.He reports that he has been smoking cigarettes. He has a 10.5 pack-year smoking history. He has never used smokeless tobacco. He reports current alcohol use. He reports that he does not use drugs.  Current Outpatient Medications on File Prior to Visit  Medication Sig Dispense Refill   Accu-Chek Softclix  Lancets lancets Check BS BID Dx E11.65 200 each 3   fluticasone (FLONASE) 50 MCG/ACT nasal spray Place 2 sprays into both nostrils daily. 16 g 6   glucose blood (ACCU-CHEK GUIDE TEST) test strip Check BS BID Dx E11.65 200 strip 3   sildenafil (REVATIO) 20 MG tablet Take 2-5 pills at once, orally, with each sexual encounter 50 tablet 5   No current facility-administered medications on file prior to visit.    ROS Review of Systems  Objective:  BP (!) 146/76   Pulse 80   Temp 98.6 F (37 C)   Ht 6\' 4"  (1.93 m)   Wt 237 lb (107.5 kg)   SpO2 96%   BMI 28.85 kg/m   BP Readings from Last 3 Encounters:  05/10/23 (!) 146/76  11/19/22 (!) 152/72  03/10/22 131/83    Wt Readings from Last 3 Encounters:  05/10/23 237 lb (107.5 kg)  11/19/22 234 lb 6.4 oz (106.3 kg)  03/10/22 230 lb (104.3 kg)    Lab Results  Component Value Date   HGBA1C 7.9 (H) 11/19/2022   HGBA1C 10.0 (H) 02/03/2022   HGBA1C 10.6 (H) 12/30/2021    Physical Exam      Assessment & Plan:  Type 2 diabetes mellitus with hyperglycemia, without long-term current use of insulin (HCC) -     Bayer DCA Hb A1c Waived -     CBC with Differential/Platelet -     CMP14+EGFR -     Lipid panel -     metFORMIN HCl ER; Take 1 tablet (500 mg total) by mouth 2 (two) times daily with a meal.  Dispense: 180 tablet;  Refill: 3  Diabetic lipidosis (HCC) -     Bayer DCA Hb A1c Waived -     CBC with Differential/Platelet -     CMP14+EGFR -     Lipid panel  Mixed hyperlipidemia -     Lipid panel  Seasonal allergic rhinitis due to pollen -     Fexofenadine HCl; Take 1 tablet (180 mg total) by mouth daily. For allergy symptoms  Dispense: 30 tablet; Refill: 11  Right arm pain  Arm pain, right -     DG Shoulder Right; Future -     DG Elbow 2 Views Right; Future -     DG Wrist Complete Right; Future  Primary hypertension  Sebaceous cyst -     Ambulatory referral to Dermatology  Other orders -     Rosuvastatin  Calcium; Take 1 tablet (20 mg total) by mouth daily.  Dispense: 90 tablet; Refill: 0 -     Trulicity; INJECT 1.5 MG SUBCUTANEOUSLY WEEKLY **NEED TO BE SEEN BEFORE NEXT REFILL**  Dispense: 2 mL; Refill: 2 -     predniSONE; Take 5 daily for 3 days followed by 4,3,2 and 1 for 3 days each.  Dispense: 45 tablet; Refill: 0 -     Lisinopril; Take 1 tablet (10 mg total) by mouth daily.  Dispense: 90 tablet; Refill: 3    Follow-up: No follow-ups on file.  Mechele Claude, M.D.

## 2023-05-11 ENCOUNTER — Encounter: Payer: Self-pay | Admitting: Family Medicine

## 2023-05-11 LAB — CMP14+EGFR
ALT: 31 IU/L (ref 0–44)
AST: 19 IU/L (ref 0–40)
Albumin: 4.5 g/dL (ref 3.8–4.9)
Alkaline Phosphatase: 104 IU/L (ref 44–121)
BUN/Creatinine Ratio: 12 (ref 9–20)
BUN: 12 mg/dL (ref 6–24)
Bilirubin Total: 0.4 mg/dL (ref 0.0–1.2)
CO2: 23 mmol/L (ref 20–29)
Calcium: 9.7 mg/dL (ref 8.7–10.2)
Chloride: 105 mmol/L (ref 96–106)
Creatinine, Ser: 1 mg/dL (ref 0.76–1.27)
Globulin, Total: 2.5 g/dL (ref 1.5–4.5)
Glucose: 118 mg/dL — ABNORMAL HIGH (ref 70–99)
Potassium: 4.5 mmol/L (ref 3.5–5.2)
Sodium: 141 mmol/L (ref 134–144)
Total Protein: 7 g/dL (ref 6.0–8.5)
eGFR: 89 mL/min/{1.73_m2} (ref >59)

## 2023-05-11 LAB — CBC WITH DIFFERENTIAL/PLATELET
Basophils Absolute: 0.1 10*3/uL (ref 0.0–0.2)
Basos: 1 %
EOS (ABSOLUTE): 0.2 10*3/uL (ref 0.0–0.4)
Eos: 3 %
Hematocrit: 46.3 % (ref 37.5–51.0)
Hemoglobin: 15.4 g/dL (ref 13.0–17.7)
Immature Grans (Abs): 0 10*3/uL (ref 0.0–0.1)
Immature Granulocytes: 0 %
Lymphocytes Absolute: 3.2 10*3/uL — ABNORMAL HIGH (ref 0.7–3.1)
Lymphs: 43 %
MCH: 31.2 pg (ref 26.6–33.0)
MCHC: 33.3 g/dL (ref 31.5–35.7)
MCV: 94 fL (ref 79–97)
Monocytes Absolute: 1.1 10*3/uL — ABNORMAL HIGH (ref 0.1–0.9)
Monocytes: 16 %
Neutrophils Absolute: 2.7 10*3/uL (ref 1.4–7.0)
Neutrophils: 37 %
Platelets: 248 10*3/uL (ref 150–450)
RBC: 4.94 x10E6/uL (ref 4.14–5.80)
RDW: 13.1 % (ref 11.6–15.4)
WBC: 7.2 10*3/uL (ref 3.4–10.8)

## 2023-05-11 LAB — LIPID PANEL
Cholesterol, Total: 115 mg/dL (ref 100–199)
HDL: 41 mg/dL (ref >39)
LDL CALC COMMENT:: 2.8 ratio (ref 0.0–5.0)
LDL Chol Calc (NIH): 42 mg/dL (ref 0–99)
Triglycerides: 201 mg/dL — ABNORMAL HIGH (ref 0–149)
VLDL Cholesterol Cal: 32 mg/dL (ref 5–40)

## 2023-05-11 NOTE — Progress Notes (Signed)
Hello Matthew Thompson,  Your lab result is normal and/or stable.Some minor variations that are not significant are commonly marked abnormal, but do not represent any medical problem for you.  Best regards, Parry Po, M.D.

## 2023-05-16 ENCOUNTER — Encounter: Payer: Self-pay | Admitting: Family Medicine

## 2023-05-17 ENCOUNTER — Telehealth: Payer: Self-pay

## 2023-05-17 NOTE — Telephone Encounter (Signed)
 Returned patient call. Patient aware, verbalized understanding. [facility] 05/17/23

## 2023-05-17 NOTE — Telephone Encounter (Signed)
 Copied from CRM [id number]. Topic: Clinical - Lab/Test Results >> May 17, 2023 10:33 AM Crispin Dolphin wrote: Reason for CRM: Patient returned missed called. Called CAL - looks like call was for x-ray results. CAL was checking to see if clinician available. Call disconnected.

## 2023-05-25 ENCOUNTER — Encounter: Payer: Self-pay | Admitting: Family Medicine

## 2023-06-24 ENCOUNTER — Encounter: Admitting: Family Medicine

## 2023-07-01 ENCOUNTER — Encounter: Payer: Self-pay | Admitting: Family Medicine

## 2023-07-22 ENCOUNTER — Other Ambulatory Visit: Payer: Self-pay | Admitting: Family Medicine

## 2023-07-22 DIAGNOSIS — E1165 Type 2 diabetes mellitus with hyperglycemia: Secondary | ICD-10-CM

## 2023-08-05 ENCOUNTER — Ambulatory Visit: Payer: Self-pay | Admitting: Dermatology

## 2023-08-11 ENCOUNTER — Ambulatory Visit: Admitting: Family Medicine

## 2023-09-22 ENCOUNTER — Ambulatory Visit: Admitting: Family Medicine

## 2023-09-22 ENCOUNTER — Encounter: Payer: Self-pay | Admitting: Family Medicine

## 2023-09-22 VITALS — BP 162/87 | HR 76 | Temp 97.7°F | Ht 76.0 in | Wt 237.8 lb

## 2023-09-22 DIAGNOSIS — E782 Mixed hyperlipidemia: Secondary | ICD-10-CM

## 2023-09-22 DIAGNOSIS — Z7984 Long term (current) use of oral hypoglycemic drugs: Secondary | ICD-10-CM | POA: Diagnosis not present

## 2023-09-22 DIAGNOSIS — E1165 Type 2 diabetes mellitus with hyperglycemia: Secondary | ICD-10-CM | POA: Diagnosis not present

## 2023-09-22 DIAGNOSIS — I10 Essential (primary) hypertension: Secondary | ICD-10-CM

## 2023-09-22 LAB — BAYER DCA HB A1C WAIVED: HB A1C (BAYER DCA - WAIVED): 8.2 % — ABNORMAL HIGH (ref 4.8–5.6)

## 2023-09-22 LAB — LIPID PANEL

## 2023-09-22 MED ORDER — TERBINAFINE HCL 250 MG PO TABS: 250.0000 mg | ORAL_TABLET | Freq: Every day | ORAL | 0 refills | Status: DC

## 2023-09-22 MED ORDER — METFORMIN HCL ER 500 MG PO TB24: 1000.0000 mg | ORAL_TABLET | Freq: Two times a day (BID) | ORAL | 3 refills | Status: AC

## 2023-09-22 MED ORDER — PREGABALIN 75 MG PO CAPS: 75.0000 mg | ORAL_CAPSULE | Freq: Every day | ORAL | 1 refills | Status: DC

## 2023-09-22 MED ORDER — TRULICITY 3 MG/0.5ML ~~LOC~~ SOAJ: 3.0000 mg | SUBCUTANEOUS | 3 refills | Status: AC

## 2023-09-22 MED ORDER — ROSUVASTATIN CALCIUM 20 MG PO TABS: 20.0000 mg | ORAL_TABLET | Freq: Every day | ORAL | 3 refills | Status: AC

## 2023-09-22 NOTE — Progress Notes (Signed)
 Subjective:  Patient ID: Matthew Thompson, male    DOB: [birth year]  Age: 58 y.o. MRN: [id number]  CC: 4 month follow up   HPI [name] presents forFollow-up of diabetes. Patient checks blood sugar at home.  Patient denies symptoms such as polyuria, polydipsia, excessive hunger, nausea No significant hypoglycemic spells noted. Medications reviewed. Pt reports taking them regularly without complication/adverse reaction being reported today.   Discussed the use of AI scribe software for clinical note transcription with the patient, who gave verbal consent to proceed.  History of Present Illness   Matthew Thompson is a 58 year old male with chronic back pain who presents with worsening lumbar spine pain.  He experiences sharp pains along his spine, particularly in the lumbar region where previous surgical work was done. The pain radiates up and down the spine, stopping at the bottom of the thoracic spine, and is exacerbated when lying in bed, making it difficult to get up. He has a spinal stimulator in place. He is currently not taking any medication specifically for the back pain, but will be starting Lyrica  (pregabalin ) at a low dose with plans to gradually increase it.  He has issues with a sebaceous cyst on his back, which becomes larger and emits a bad odor when he sweats. He has attempted to schedule a procedure to address this but has faced scheduling conflicts.  He has a history of diabetes with a recent A1c of 8.2. He has poor dietary habits, including consuming sweets and alcohol during a recent family reunion. He is currently taking Trulicity  for diabetes management and reports no adverse side effects from the medication.  He experiences numbness in his right foot, which he associates with his back pain. The numbness is more pronounced when his back pain is severe, but he can regain sensation when the back pain subsides.      History Preciliano has a past medical history of  Diabetes mellitus without complication (HCC) and Hypertension.   He has a past surgical history that includes left hand and Hernia repair.   His family history includes Cancer in his father and mother; Diabetes in his brother, father, mother, and sister; Heart disease in his mother; Hypertension in his brother, father, mother, and sister; Stroke in his mother.He reports that he has been smoking cigarettes. He has a 10.5 pack-year smoking history. He has never used smokeless tobacco. He reports current alcohol use. He reports that he does not use drugs.  Current Outpatient Medications on File Prior to Visit  Medication Sig Dispense Refill   Accu-Chek Softclix Lancets lancets Check BS BID Dx E11.65 200 each 3   glucose blood (ACCU-CHEK GUIDE TEST) test strip Check BS BID Dx E11.65 200 strip 3   fexofenadine  (ALLEGRA ) 180 MG tablet Take 1 tablet (180 mg total) by mouth daily. For allergy symptoms (Patient not taking: Reported on 09/22/2023) 30 tablet 11   fluticasone  (FLONASE ) 50 MCG/ACT nasal spray Place 2 sprays into both nostrils daily. (Patient not taking: Reported on 09/22/2023) 16 g 6   lisinopril  (ZESTRIL ) 10 MG tablet Take 1 tablet (10 mg total) by mouth daily. (Patient not taking: Reported on 09/22/2023) 90 tablet 3   sildenafil  (REVATIO ) 20 MG tablet Take 2-5 pills at once, orally, with each sexual encounter (Patient not taking: Reported on 09/22/2023) 50 tablet 5   No current facility-administered medications on file prior to visit.    ROS Review of Systems  Constitutional: Negative.   HENT: Negative.  Eyes:  Negative for visual disturbance.  Respiratory:  Negative for cough and shortness of breath.   Cardiovascular:  Negative for chest pain and leg swelling.  Gastrointestinal:  Negative for abdominal pain, diarrhea, nausea and vomiting.  Genitourinary:  Negative for difficulty urinating.  Musculoskeletal:  Positive for back pain (has implanted stimulator). Negative for arthralgias  and myalgias.  Skin:  Negative for rash.  Neurological:  Negative for headaches.  Psychiatric/Behavioral:  Negative for sleep disturbance.     Objective:  BP (!) 162/87   Pulse 76   Temp 97.7 F (36.5 C)   Ht 6' 4 (1.93 m)   Wt 237 lb 12.8 oz (107.9 kg)   SpO2 98%   BMI 28.95 kg/m   BP Readings from Last 3 Encounters:  09/22/23 (!) 162/87  05/10/23 (!) 146/76  11/19/22 (!) 152/72    Wt Readings from Last 3 Encounters:  09/22/23 237 lb 12.8 oz (107.9 kg)  05/10/23 237 lb (107.5 kg)  11/19/22 234 lb 6.4 oz (106.3 kg)    Lab Results  Component Value Date   HGBA1C 8.2 (H) 09/22/2023   HGBA1C 7.0 (H) 05/10/2023   HGBA1C 7.9 (H) 11/19/2022    Physical Exam Constitutional:      General: He is not in acute distress.    Appearance: He is well-developed.  HENT:     Head: Normocephalic and atraumatic.     Right Ear: External ear normal.     Left Ear: External ear normal.     Nose: Nose normal.  Eyes:     Conjunctiva/sclera: Conjunctivae normal.     Pupils: Pupils are equal, round, and reactive to light.  Cardiovascular:     Rate and Rhythm: Normal rate and regular rhythm.     Heart sounds: Normal heart sounds. No murmur heard. Pulmonary:     Effort: Pulmonary effort is normal. No respiratory distress.     Breath sounds: Normal breath sounds. No wheezing or rales.  Abdominal:     Palpations: Abdomen is soft.     Tenderness: There is no abdominal tenderness.  Musculoskeletal:        General: Normal range of motion.     Cervical back: Normal range of motion and neck supple.  Skin:    General: Skin is warm and dry.  Neurological:     Mental Status: He is alert and oriented to person, place, and time.     Deep Tendon Reflexes: Reflexes are normal and symmetric.  Psychiatric:        Behavior: Behavior normal.        Thought Content: Thought content normal.        Judgment: Judgment normal.      Diabetic foot exam was performed with the following findings:    Intact posterior tibialis and dorsalis pedis pulses Onychomycosis and hammer toe. Right  side bunion with callus. Toes numb to monofilament light touch on right. Poor at ist toe on left     Assessment & Plan:  Type 2 diabetes mellitus with hyperglycemia, without long-term current use of insulin (HCC) -     Bayer DCA Hb A1c Waived -     Microalbumin / creatinine urine ratio -     metFORMIN  HCl ER; Take 2 tablets (1,000 mg total) by mouth 2 (two) times daily with a meal.  Dispense: 360 tablet; Refill: 3  Mixed hyperlipidemia -     CBC with Differential/Platelet -     CMP14+EGFR  Primary hypertension -  Lipid panel  Other orders -     Rosuvastatin  Calcium ; Take 1 tablet (20 mg total) by mouth daily.  Dispense: 90 tablet; Refill: 3 -     Pregabalin ; Take 1 capsule (75 mg total) by mouth at bedtime.  Dispense: 90 capsule; Refill: 1 -     Trulicity ; Inject 3 mg as directed once a week.  Dispense: 6 mL; Refill: 3 -     Terbinafine  HCl; Take 1 tablet (250 mg total) by mouth daily.  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 Surgeon indicated no expected improvement. Pain likely due to muscle overpowering support structures. - Refer to specialist for further evaluation and possible imaging. - Initiate Lyrica  (pregabalin ) at low dose, titrate monthly based on tolerance and efficacy.  Type 2 diabetes mellitus, uncontrolled A1c at 8.2 indicates uncontrolled diabetes. High intake of sweets and alcohol noted. - Increase Trulicity  dose to 3 mg. - Advise dietary modifications to reduce sweets, alcohol, and carbohydrates.  Sebaceous cyst of back, draining Draining sebaceous cyst with foul odor. Prefers removal by current provider or partner. - Schedule removal of sebaceous cyst with provider or partner, avoiding Fridays. - Apply a bandage to the draining  cyst.  Onychomycosis (fungal infection of toenails) Fungal infection causing discoloration. Oral antifungal treatment has 2/3 success rate for nail regrowth in 15-18 months. - Prescribe oral antifungal medication for 3 months.  Bunion with callus formation, foot Bunion with callus causing discomfort. - Advise on callus management.  Right foot numbness, likely neuropathic Numbness in right foot possibly related to lumbar spine issues, exacerbated by back pain. - Monitor for any sores or injuries on the feet due to numbness.         Follow-up: Return in about 3 months (around 12/23/2023).  Butler Der, M.D.

## 2023-09-23 ENCOUNTER — Other Ambulatory Visit: Payer: Self-pay | Admitting: Family Medicine

## 2023-09-23 LAB — CBC WITH DIFFERENTIAL/PLATELET
Basophils Absolute: 0.1 x10E3/uL (ref 0.0–0.2)
Basos: 1 %
EOS (ABSOLUTE): 0.2 x10E3/uL (ref 0.0–0.4)
Eos: 2 %
Hematocrit: 48.3 % (ref 37.5–51.0)
Hemoglobin: 16.1 g/dL (ref 13.0–17.7)
Immature Grans (Abs): 0 x10E3/uL (ref 0.0–0.1)
Immature Granulocytes: 0 %
Lymphocytes Absolute: 3.2 x10E3/uL — ABNORMAL HIGH (ref 0.7–3.1)
Lymphs: 32 %
MCH: 32.3 pg (ref 26.6–33.0)
MCHC: 33.3 g/dL (ref 31.5–35.7)
MCV: 97 fL (ref 79–97)
Monocytes Absolute: 1.2 x10E3/uL — ABNORMAL HIGH (ref 0.1–0.9)
Monocytes: 12 %
Neutrophils Absolute: 5.2 x10E3/uL (ref 1.4–7.0)
Neutrophils: 53 %
Platelets: 254 x10E3/uL (ref 150–450)
RBC: 4.98 x10E6/uL (ref 4.14–5.80)
RDW: 13.1 % (ref 11.6–15.4)
WBC: 9.9 x10E3/uL (ref 3.4–10.8)

## 2023-09-23 LAB — MICROALBUMIN / CREATININE URINE RATIO
Creatinine, Urine: 85.5 mg/dL
Microalb/Creat Ratio: 48 mg/g{creat} — ABNORMAL HIGH (ref 0–29)
Microalbumin, Urine: 40.9 ug/mL

## 2023-09-23 LAB — CMP14+EGFR
ALT: 25 IU/L (ref 0–44)
AST: 16 IU/L (ref 0–40)
Albumin: 4.6 g/dL (ref 3.8–4.9)
Alkaline Phosphatase: 126 IU/L — ABNORMAL HIGH (ref 44–121)
BUN/Creatinine Ratio: 13 (ref 9–20)
BUN: 13 mg/dL (ref 6–24)
Bilirubin Total: 0.4 mg/dL (ref 0.0–1.2)
CO2: 22 mmol/L (ref 20–29)
Calcium: 9.6 mg/dL (ref 8.7–10.2)
Chloride: 99 mmol/L (ref 96–106)
Creatinine, Ser: 0.98 mg/dL (ref 0.76–1.27)
Globulin, Total: 2.8 g/dL (ref 1.5–4.5)
Glucose: 178 mg/dL — ABNORMAL HIGH (ref 70–99)
Potassium: 4.9 mmol/L (ref 3.5–5.2)
Sodium: 137 mmol/L (ref 134–144)
Total Protein: 7.4 g/dL (ref 6.0–8.5)
eGFR: 91 mL/min/1.73 (ref >59)

## 2023-09-23 LAB — LIPID PANEL
Cholesterol, Total: 174 mg/dL (ref 100–199)
HDL: 42 mg/dL (ref >39)
LDL CALC COMMENT:: 4.1 ratio (ref 0.0–5.0)
LDL Chol Calc (NIH): 100 mg/dL — AB (ref 0–99)
Triglycerides: 187 mg/dL — ABNORMAL HIGH (ref 0–149)
VLDL Cholesterol Cal: 32 mg/dL (ref 5–40)

## 2023-09-23 NOTE — Telephone Encounter (Signed)
 Copied from CRM #[id number]. Topic: Clinical - Medication Refill >> Sep 23, 2023  2:39 PM Zebedee SAUNDERS wrote: Medication: pregabalin  (LYRICA ) 75 MG capsule  Has the patient contacted their pharmacy? Yes (Agent: If no, request that the patient contact the pharmacy for the refill. If patient does not wish to contact the pharmacy document the reason why and proceed with request.) (Agent: If yes, when and what did the pharmacy advise?)Pharmacy need PCP approval and script.  This is the patient's preferred pharmacy:  [id number] [place], [place] - [address] [facility] [address] [place] [address] Phone: [phone] Fax: [phone]  Is this the correct pharmacy for this prescription? Yes If no, delete pharmacy and type the correct one.   Has the prescription been filled recently? Yes  Is the patient out of the medication? Yes  Has the patient been seen for an appointment in the last year OR does the patient have an upcoming appointment? Yes  Can we respond through MyChart? Yes  Agent: Please be advised that Rx refills may take up to 3 business days. We ask that you follow-up with your pharmacy.

## 2023-09-24 ENCOUNTER — Other Ambulatory Visit: Payer: Self-pay | Admitting: Family Medicine

## 2023-09-24 NOTE — Telephone Encounter (Signed)
 Script from 09/22/23 visit says print, please E-prescribe to [facility]

## 2023-09-26 MED ORDER — PREGABALIN 75 MG PO CAPS: 75.0000 mg | ORAL_CAPSULE | Freq: Every day | ORAL | 1 refills | Status: AC

## 2023-09-27 ENCOUNTER — Ambulatory Visit: Payer: Self-pay | Admitting: Family Medicine

## 2023-09-27 NOTE — Progress Notes (Signed)
Hello Matthew Thompson,  Your lab result is normal and/or stable.Some minor variations that are not significant are commonly marked abnormal, but do not represent any medical problem for you.  Best regards, Parry Po, M.D.

## 2023-10-05 ENCOUNTER — Encounter: Payer: Self-pay | Admitting: Family Medicine

## 2023-10-05 ENCOUNTER — Ambulatory Visit: Admitting: Family Medicine

## 2023-10-05 VITALS — BP 154/54 | HR 71 | Temp 98.5°F | Ht 76.0 in | Wt 234.0 lb

## 2023-10-05 DIAGNOSIS — D235 Other benign neoplasm of skin of trunk: Secondary | ICD-10-CM

## 2023-10-05 NOTE — Progress Notes (Signed)
 Chief Complaint  Patient presents with   cyst(s) excision    HPI  Patient presents today for sebaceous cyst removal. Verbal consent obtained after discussion of possible risks  PMH: Smoking status noted Review of Systems  Objective: BP (!) 154/54   Pulse 71   Temp 98.5 F (36.9 C)   Ht 6' 4 (1.93 m)   Wt 234 lb (106.1 kg)   SpO2 99%   BMI 28.48 kg/m  Gen: NAD, alert, cooperative with exam HEENT: NCAT, EOMI, PERRL CV: RRR, good S1/S2, no murmur Resp: CTABL, no wheezes, non-labored  sebaceous cyst Upper back near midline, 3X4 CM, raised The pt was prepped with betadine and draped in sterile fashion. Local anesthesia obtained using 4 cc 2% lidocaine  with epi. A 15 blade scalpel was used to create an elliptical incision surrounding the lesion following the skin lines. The lesion was then shelled from its bed using scissor technique. The  bed of the cyst was irrigated with sterile water. Specimen was placed in fixative media for lab transport. The lesion was then closed using 3.0  nylon in a running fashion for skin closure.  After cleaning, the wound was dressed using neosporin and gauze.   Wound care reviewed. Signs and symptoms of infection also reviewed with pt   Dermoid cyst of skin of back -     Anatomic Pathology Report  Suture removal, 9 days Butler Der, MD

## 2023-10-11 LAB — ANATOMIC PATHOLOGY REPORT

## 2023-10-14 ENCOUNTER — Ambulatory Visit: Admitting: Family Medicine

## 2023-10-14 VITALS — BP 132/71 | HR 71 | Temp 98.5°F | Ht 76.0 in | Wt 236.0 lb

## 2023-10-14 DIAGNOSIS — Z5189 Encounter for other specified aftercare: Secondary | ICD-10-CM

## 2023-10-14 NOTE — Progress Notes (Unsigned)
 Chief Complaint  Patient presents with   Suture / Staple Removal    HPI   History of Present Illness  Here today for wound check of recently excised cyst on his back. Pt. Denies pain. States it is still oozing some serosanguinous DC daily. Amount is miinimal.   Objective: BP 132/71   Pulse 71   Temp 98.5 F (36.9 C)   Ht 6' 4 (1.93 m)   Wt 236 lb (107 kg)   SpO2 98%   BMI 28.73 kg/m  Gen: NAD, alert, cooperative with exam Skin: The lesion on the upper back has a line of sutures It is partially open, but healing appropriately otherwise. No sign of infection. Neuro: Alert and oriented, No gross deficits  Encounter for wound re-check    Assessment and Plan Assessment & Plan  Follow up in 1 week. Wound care reviewed     Butler Der, MD

## 2023-10-21 ENCOUNTER — Ambulatory Visit (INDEPENDENT_AMBULATORY_CARE_PROVIDER_SITE_OTHER): Admitting: Family Medicine

## 2023-10-21 ENCOUNTER — Encounter: Payer: Self-pay | Admitting: Family Medicine

## 2023-10-21 VITALS — BP 175/52 | HR 77 | Temp 98.3°F | Ht 76.0 in | Wt 236.0 lb

## 2023-10-21 DIAGNOSIS — H6123 Impacted cerumen, bilateral: Secondary | ICD-10-CM | POA: Diagnosis not present

## 2023-10-29 ENCOUNTER — Encounter: Payer: Self-pay | Admitting: Family Medicine

## 2023-10-29 NOTE — Progress Notes (Signed)
 Subjective:  Patient ID: Matthew Thompson, male    DOB: [birth year]  Age: 58 y.o. MRN: [id number]  CC: Wound Check (1 week follow up)   HPI  Discussed the use of AI scribe software for clinical note transcription with the patient, who gave verbal consent to proceed.  History of Present Illness Matthew Thompson is a 58 year old male who presents for suture removal and wound evaluation.  He had stitches removed from a wound that had been left in place longer than usual due to prolonged bleeding and oozing. The stitches had become embedded, making removal more difficult and uncomfortable. The wound had been itching, and he was concerned about a possible infection, although he had been applying Neosporin daily and keeping it clean.  The wound had been draining, and a seroma had formed. The bandage had been on since the previous night, and there was a noticeable smell when it was removed, raising concerns about infection.  He did not report significant pain during the suture removal, although there was some discomfort due to the stitches being adhered to the flesh. The wound had bled and oozed for several days, which is unusual and contributed to the discomfort during suture removal.  He reported itching at the wound site and drainage from the wound. No significant pain during the suture removal process.          10/21/2023    2:44 PM 10/14/2023    9:27 AM 10/05/2023   11:16 AM  Depression screen PHQ 2/9  Decreased Interest 0 0 0  Down, Depressed, Hopeless 0 0 0  PHQ - 2 Score 0 0 0    History Matthew Thompson has a past medical history of Diabetes mellitus without complication (HCC) and Hypertension.   He has a past surgical history that includes left hand and Hernia repair.   His family history includes Cancer in his father and mother; Diabetes in his brother, father, mother, and sister; Heart disease in his mother; Hypertension in his brother, father, mother, and sister; Stroke in his  mother.He reports that he has been smoking cigarettes. He has a 10.5 pack-year smoking history. He has never used smokeless tobacco. He reports current alcohol use. He reports that he does not use drugs.    ROS Review of Systems  Constitutional:  Negative for fever.  Respiratory:  Negative for shortness of breath.   Cardiovascular:  Negative for chest pain.  Musculoskeletal:  Negative for arthralgias.  Skin:  Negative for rash.    Objective:  BP (!) 175/52   Pulse 77   Temp 98.3 F (36.8 C)   Ht 6' 4 (1.93 m)   Wt 236 lb (107 kg)   SpO2 97%   BMI 28.73 kg/m   BP Readings from Last 3 Encounters:  10/21/23 (!) 175/52  10/14/23 132/71  10/05/23 (!) 154/54    Wt Readings from Last 3 Encounters:  10/21/23 236 lb (107 kg)  10/14/23 236 lb (107 kg)  10/05/23 234 lb (106.1 kg)     Physical Exam Physical Exam GENERAL: Alert, cooperative, well developed, no acute distress. HEENT: Normocephalic, normal oropharynx, moist mucous membranes. Cerumen impaction removed from left ear. CHEST: Clear to auscultation bilaterally, no wheezes, rhonchi, or crackles. CARDIOVASCULAR: Normal heart rate and rhythm, S1 and S2 normal without murmurs. ABDOMEN: Soft, non-tender, non-distended, without organomegaly, normal bowel sounds. EXTREMITIES: No cyanosis or edema. NEUROLOGICAL: Cranial nerves grossly intact, moves all extremities without gross motor or sensory deficit. SKIN: Stitches removed from  wound site.   Assessment & Plan:  Bilateral impacted cerumen    Assessment and Plan Assessment & Plan Postoperative wound healing after cyst excision on back   The wound is healing slower than expected due to the depth of the excision reaching the muscle. Stitches were left in longer due to oozing, causing discomfort during removal. A seroma formed, leading to stitch adherence to tissue. The wound is not infected but has a slight odor upon bandage removal and is still slightly draining. Apply  SteriStrips to support healing and prevent reopening. Advise avoiding excessive motion to prevent stress on the wound. Monitor for signs of infection or reopening. Allow SteriStrips to fall off naturally.  Cerumen impaction, left ear   Cerumen impaction in the left ear was resolved with the successful removal of a large plug using a cerumen spoon. No further immediate action is required.       Follow-up: Return if symptoms worsen or fail to improve.  Butler Der, M.D.

## 2023-11-24 ENCOUNTER — Ambulatory Visit: Payer: Self-pay | Admitting: Dermatology

## 2023-12-06 LAB — OPHTHALMOLOGY REPORT-SCANNED

## 2023-12-19 ENCOUNTER — Other Ambulatory Visit: Payer: Self-pay | Admitting: *Deleted

## 2023-12-28 ENCOUNTER — Encounter: Payer: Self-pay | Admitting: Family Medicine

## 2023-12-28 ENCOUNTER — Ambulatory Visit: Payer: Self-pay | Admitting: Family Medicine

## 2023-12-28 VITALS — BP 186/97 | HR 69 | Temp 98.3°F | Ht 76.0 in | Wt 240.0 lb

## 2023-12-28 DIAGNOSIS — I10 Essential (primary) hypertension: Secondary | ICD-10-CM

## 2023-12-28 DIAGNOSIS — E1165 Type 2 diabetes mellitus with hyperglycemia: Secondary | ICD-10-CM

## 2023-12-28 DIAGNOSIS — E782 Mixed hyperlipidemia: Secondary | ICD-10-CM

## 2023-12-28 DIAGNOSIS — M5416 Radiculopathy, lumbar region: Secondary | ICD-10-CM | POA: Diagnosis not present

## 2023-12-28 DIAGNOSIS — E1169 Type 2 diabetes mellitus with other specified complication: Secondary | ICD-10-CM | POA: Diagnosis not present

## 2023-12-28 DIAGNOSIS — Z7984 Long term (current) use of oral hypoglycemic drugs: Secondary | ICD-10-CM

## 2023-12-28 DIAGNOSIS — E756 Lipid storage disorder, unspecified: Secondary | ICD-10-CM

## 2023-12-28 LAB — BAYER DCA HB A1C WAIVED: HB A1C (BAYER DCA - WAIVED): 7 % — ABNORMAL HIGH (ref 4.8–5.6)

## 2023-12-28 NOTE — Progress Notes (Signed)
 Subjective:  Patient ID: GILAD DUGGER, male    DOB: 05/22/1965  Age: 58 y.o. MRN: [id number]  CC: Medical Management of Chronic Issues   HPI  Discussed the use of AI scribe software for clinical note transcription with the patient, who gave verbal consent to proceed.  History of Present Illness RUFORD DUDZINSKI is a 58 year old male with diabetes and chronic back pain who presents for diabetes management and back pain evaluation.  Diabetes management is progressing well with blood sugar levels ranging between 97 and 116 mg/dL. He is currently on Farxiga , Trulicity , and a reduced dose of metformin , now one tablet per day, down from four tablets.  He experiences chronic back pain and is concerned about a spot on his back that took a long time to heal. He describes persistent discomfort from a rod in his back, which is particularly bothersome in the mornings. Initially, the rod provided relief for about three weeks, but now it feels like it is 'sticking me real hard'. He has not consulted the doctor who placed the rod for about a year and a half. He was previously prescribed pregabalin  for pain relief but discontinued its use. He has difficulty getting up in the morning and turning due to the pain. He has not been taking nabumetone , which was prescribed over a year and a half ago.  He is also taking Crestor  for cholesterol management and a medication for toenail fungus, which he took for about a month and a half, although it was intended for a three-month course. He vapes but does not smoke cigarettes.          12/28/2023    8:53 AM 10/21/2023    2:44 PM 10/14/2023    9:27 AM  Depression screen PHQ 2/9  Decreased Interest 0 0 0  Down, Depressed, Hopeless 0 0 0  PHQ - 2 Score 0 0 0  Altered sleeping 0    Tired, decreased energy 0    Change in appetite 0    Feeling bad or failure about yourself  0    Trouble concentrating 0    Moving slowly or fidgety/restless 3    Suicidal thoughts  0    PHQ-9 Score 3      History Wendelin has a past medical history of Diabetes mellitus without complication (HCC) and Hypertension.   He has a past surgical history that includes left hand and Hernia repair.   His family history includes Cancer in his father and mother; Diabetes in his brother, father, mother, and sister; Heart disease in his mother; Hypertension in his brother, father, mother, and sister; Stroke in his mother.He reports that he has been smoking cigarettes. He has a 10.5 pack-year smoking history. He has never used smokeless tobacco. He reports current alcohol use. He reports that he does not use drugs.    ROS Review of Systems  Constitutional: Negative.   HENT: Negative.    Eyes:  Negative for visual disturbance.  Respiratory:  Negative for cough and shortness of breath.   Cardiovascular:  Negative for chest pain and leg swelling.  Gastrointestinal:  Negative for abdominal pain, diarrhea, nausea and vomiting.  Genitourinary:  Negative for difficulty urinating.  Musculoskeletal:  Positive for back pain. Negative for arthralgias and myalgias.  Skin:  Negative for rash.  Neurological:  Negative for headaches.  Psychiatric/Behavioral:  Negative for sleep disturbance.     Objective:  BP (!) 186/97   Pulse 69   Temp 98.3 F (  36.8 C)   Ht 6' 4 (1.93 m)   Wt 240 lb (108.9 kg)   SpO2 97%   BMI 29.21 kg/m   BP Readings from Last 3 Encounters:  12/28/23 (!) 186/97  10/21/23 (!) 175/52  10/14/23 132/71    Wt Readings from Last 3 Encounters:  12/28/23 240 lb (108.9 kg)  10/21/23 236 lb (107 kg)  10/14/23 236 lb (107 kg)     Physical Exam Vitals reviewed.  Constitutional:      Appearance: He is well-developed.  HENT:     Head: Normocephalic and atraumatic.     Right Ear: External ear normal.     Left Ear: External ear normal.     Mouth/Throat:     Pharynx: No oropharyngeal exudate or posterior oropharyngeal erythema.  Eyes:     Pupils: Pupils are  equal, round, and reactive to light.  Cardiovascular:     Rate and Rhythm: Normal rate and regular rhythm.     Heart sounds: No murmur heard. Pulmonary:     Effort: No respiratory distress.     Breath sounds: Normal breath sounds.  Musculoskeletal:     Cervical back: Normal range of motion and neck supple.     Comments: Spine stimulator is palpable at left lumabr back.    Neurological:     Mental Status: He is alert and oriented to person, place, and time.    Physical Exam GENERAL: Alert, cooperative, well developed, no acute distress. HEENT: Normocephalic, normal oropharynx, moist mucous membranes. CHEST: Clear to auscultation bilaterally, no wheezes, rhonchi, or crackles. CARDIOVASCULAR: Normal heart rate and rhythm, S1 and S2 normal without murmurs. ABDOMEN: Soft, non-tender, non-distended, without organomegaly, normal bowel sounds. EXTREMITIES: No cyanosis or edema. NEUROLOGICAL: Cranial nerves grossly intact, moves all extremities without gross motor or sensory deficit.   Assessment & Plan:  Diabetic lipidosis (HCC) -     Bayer DCA Hb A1c Waived -     CBC with Differential/Platelet -     Comprehensive metabolic panel with GFR -     Lipid panel -     Vitamin B12  Primary hypertension -     CBC with Differential/Platelet  Mixed hyperlipidemia -     Comprehensive metabolic panel with GFR -     Lipid panel  Right lumbar radiculopathy    Assessment and Plan Assessment & Plan Chronic back pain with spinal hardware   Chronic back pain is exacerbated by physical activity. Previous pregabalin  use was ineffective. He reports discomfort from spinal hardware, especially in the morning and when turning. Disability consideration was discussed, but he is hesitant due to age concerns. Resume pregabalin  at bedtime, increasing to two tablets if no relief after several days without side effects. Follow up with a back specialist for evaluation of spinal hardware and pain management.  Discuss disability options with the specialist if necessary.  Type 2 diabetes mellitus   Diabetes is well-controlled with current medications. Blood glucose levels range from 97 to 116 mg/dL. He has reduced metformin  dosage from four tablets to one tablet daily without affecting glucose control. Awaiting A1c results for further assessment. Continue current diabetes medications: Farxiga , Trulicity , and reduced dose of metformin . Await A1c results for further evaluation of diabetes control.  Mixed hyperlipidemia   Mixed hyperlipidemia is managed with rosuvastatin  (Crestor ). Continue rosuvastatin  as prescribed.  Postoperative wound healing after cyst excision on back   Postoperative wound healing on the back is progressing well. A small cyst remains but is not currently being  addressed due to previous trauma from excision. Continue to monitor the cyst; consider excision if it becomes problematic.  Onychomycosis (toenail fungus)   Onychomycosis is treated with antifungal medication for approximately one month. A refill is provided for additional treatment. Limit treatment to three months to avoid liver complications. Continue antifungal treatment for up to three months total. Monitor liver function through routine blood work.       Follow-up: No follow-ups on file.  Butler Der, M.D.

## 2023-12-29 ENCOUNTER — Ambulatory Visit: Payer: Self-pay | Admitting: Family Medicine

## 2023-12-29 LAB — COMPREHENSIVE METABOLIC PANEL WITH GFR
ALT: 23 IU/L (ref 0–44)
AST: 21 IU/L (ref 0–40)
Albumin: 4.6 g/dL (ref 3.8–4.9)
Alkaline Phosphatase: 97 IU/L (ref 47–123)
BUN/Creatinine Ratio: 9 (ref 9–20)
BUN: 9 mg/dL (ref 6–24)
Bilirubin Total: 0.6 mg/dL (ref 0.0–1.2)
CO2: 22 mmol/L (ref 20–29)
Calcium: 9.9 mg/dL (ref 8.7–10.2)
Chloride: 101 mmol/L (ref 96–106)
Creatinine, Ser: 0.95 mg/dL (ref 0.76–1.27)
Globulin, Total: 2.8 g/dL (ref 1.5–4.5)
Glucose: 156 mg/dL — ABNORMAL HIGH (ref 70–99)
Potassium: 4.2 mmol/L (ref 3.5–5.2)
Sodium: 139 mmol/L (ref 134–144)
Total Protein: 7.4 g/dL (ref 6.0–8.5)
eGFR: 95 mL/min/1.73 (ref >59)

## 2023-12-29 LAB — CBC WITH DIFFERENTIAL/PLATELET
Basophils Absolute: 0 x10E3/uL (ref 0.0–0.2)
Basos: 1 %
EOS (ABSOLUTE): 0.1 x10E3/uL (ref 0.0–0.4)
Eos: 2 %
Hematocrit: 44.7 % (ref 37.5–51.0)
Hemoglobin: 15 g/dL (ref 13.0–17.7)
Immature Grans (Abs): 0 x10E3/uL (ref 0.0–0.1)
Immature Granulocytes: 0 %
Lymphocytes Absolute: 2.9 x10E3/uL (ref 0.7–3.1)
Lymphs: 41 %
MCH: 31.8 pg (ref 26.6–33.0)
MCHC: 33.6 g/dL (ref 31.5–35.7)
MCV: 95 fL (ref 79–97)
Monocytes Absolute: 1 x10E3/uL — ABNORMAL HIGH (ref 0.1–0.9)
Monocytes: 15 %
Neutrophils Absolute: 2.8 x10E3/uL (ref 1.4–7.0)
Neutrophils: 41 %
Platelets: 274 x10E3/uL (ref 150–450)
RBC: 4.71 x10E6/uL (ref 4.14–5.80)
RDW: 13.3 % (ref 11.6–15.4)
WBC: 6.9 x10E3/uL (ref 3.4–10.8)

## 2023-12-29 LAB — LIPID PANEL
Chol/HDL Ratio: 4.3 ratio (ref 0.0–5.0)
Cholesterol, Total: 181 mg/dL (ref 100–199)
HDL: 42 mg/dL (ref >39)
LDL Chol Calc (NIH): 114 mg/dL — ABNORMAL HIGH (ref 0–99)
Triglycerides: 141 mg/dL (ref 0–149)
VLDL Cholesterol Cal: 25 mg/dL (ref 5–40)

## 2023-12-29 LAB — VITAMIN B12: Vitamin B-12: 386 pg/mL (ref 232–1245)

## 2024-03-29 ENCOUNTER — Ambulatory Visit: Admitting: Family Medicine
